# Patient Record
Sex: Female | Born: 1965
Health system: Southern US, Community
[De-identification: ages and names within clinical notes are randomized; demographics above are authoritative.]

## PROBLEM LIST (undated history)

## (undated) DIAGNOSIS — J449 Chronic obstructive pulmonary disease, unspecified: Secondary | ICD-10-CM

## (undated) DIAGNOSIS — J9611 Chronic respiratory failure with hypoxia: Secondary | ICD-10-CM

## (undated) DIAGNOSIS — E785 Hyperlipidemia, unspecified: Secondary | ICD-10-CM

## (undated) DIAGNOSIS — C3490 Malignant neoplasm of unspecified part of unspecified bronchus or lung: Secondary | ICD-10-CM

## (undated) DIAGNOSIS — I10 Essential (primary) hypertension: Secondary | ICD-10-CM

## (undated) DIAGNOSIS — R079 Chest pain, unspecified: Secondary | ICD-10-CM

## (undated) DIAGNOSIS — F319 Bipolar disorder, unspecified: Secondary | ICD-10-CM

## (undated) DIAGNOSIS — R42 Dizziness and giddiness: Secondary | ICD-10-CM

## (undated) DIAGNOSIS — R200 Anesthesia of skin: Secondary | ICD-10-CM

## (undated) DIAGNOSIS — E039 Hypothyroidism, unspecified: Secondary | ICD-10-CM

## (undated) DIAGNOSIS — J432 Centrilobular emphysema: Secondary | ICD-10-CM

## (undated) DIAGNOSIS — Z720 Tobacco use: Secondary | ICD-10-CM

## (undated) DIAGNOSIS — J441 Chronic obstructive pulmonary disease with (acute) exacerbation: Secondary | ICD-10-CM

## (undated) DIAGNOSIS — F329 Major depressive disorder, single episode, unspecified: Secondary | ICD-10-CM

## (undated) HISTORY — DX: Dizziness and giddiness: R42

## (undated) HISTORY — DX: Malignant neoplasm of unspecified part of unspecified bronchus or lung: C34.90

## (undated) HISTORY — DX: Chronic respiratory failure with hypoxia: J96.11

## (undated) HISTORY — DX: Hypothyroidism, unspecified: E03.9

## (undated) HISTORY — PX: ABDOMINAL HYSTERECTOMY: SUR658

## (undated) HISTORY — PX: LUNG REMOVAL, PARTIAL: SHX233

## (undated) HISTORY — DX: Chronic obstructive pulmonary disease, unspecified: J44.9

## (undated) HISTORY — DX: Bipolar disorder, unspecified: F31.9

## (undated) HISTORY — DX: Chest pain, unspecified: R07.9

## (undated) HISTORY — DX: Hyperlipidemia, unspecified: E78.5

## (undated) HISTORY — DX: Tobacco use: Z72.0

## (undated) HISTORY — DX: Major depressive disorder, single episode, unspecified: F32.9

## (undated) HISTORY — DX: Centrilobular emphysema: J43.2

## (undated) HISTORY — DX: Essential (primary) hypertension: I10

## (undated) HISTORY — PX: INCONTINENCE SURGERY: SHX676

## (undated) HISTORY — DX: Chronic obstructive pulmonary disease with (acute) exacerbation: J44.1

## (undated) HISTORY — DX: Anesthesia of skin: R20.0

---

## 2002-07-12 ENCOUNTER — Encounter: Payer: Self-pay | Admitting: Chiropractic Medicine

## 2002-07-12 ENCOUNTER — Ambulatory Visit (HOSPITAL_COMMUNITY): Admission: RE | Admit: 2002-07-12 | Discharge: 2002-07-12 | Payer: Self-pay | Admitting: Chiropractic Medicine

## 2002-08-03 ENCOUNTER — Encounter: Admission: RE | Admit: 2002-08-03 | Discharge: 2002-08-03 | Payer: Self-pay | Admitting: Unknown Physician Specialty

## 2002-08-03 ENCOUNTER — Encounter: Payer: Self-pay | Admitting: Unknown Physician Specialty

## 2006-05-31 ENCOUNTER — Inpatient Hospital Stay (HOSPITAL_COMMUNITY): Admission: EM | Admit: 2006-05-31 | Discharge: 2006-06-07 | Payer: Self-pay | Admitting: Psychiatry

## 2006-05-31 ENCOUNTER — Ambulatory Visit: Payer: Self-pay | Admitting: Psychiatry

## 2006-09-12 ENCOUNTER — Inpatient Hospital Stay (HOSPITAL_COMMUNITY): Admission: AD | Admit: 2006-09-12 | Discharge: 2006-09-20 | Payer: Self-pay | Admitting: Psychiatry

## 2006-09-12 ENCOUNTER — Ambulatory Visit: Payer: Self-pay | Admitting: Psychiatry

## 2007-11-08 ENCOUNTER — Ambulatory Visit (HOSPITAL_COMMUNITY): Admission: RE | Admit: 2007-11-08 | Discharge: 2007-11-08 | Payer: Self-pay | Admitting: Family Medicine

## 2007-11-17 ENCOUNTER — Ambulatory Visit: Payer: Self-pay | Admitting: Pulmonary Disease

## 2007-11-17 DIAGNOSIS — I10 Essential (primary) hypertension: Secondary | ICD-10-CM | POA: Insufficient documentation

## 2007-11-23 ENCOUNTER — Ambulatory Visit: Payer: Self-pay | Admitting: Pulmonary Disease

## 2007-11-23 ENCOUNTER — Encounter: Payer: Self-pay | Admitting: Pulmonary Disease

## 2007-11-23 ENCOUNTER — Ambulatory Visit: Admission: RE | Admit: 2007-11-23 | Discharge: 2007-11-23 | Payer: Self-pay | Admitting: Pulmonary Disease

## 2007-11-23 DIAGNOSIS — J984 Other disorders of lung: Secondary | ICD-10-CM

## 2007-11-28 ENCOUNTER — Ambulatory Visit: Payer: Self-pay | Admitting: Pulmonary Disease

## 2007-11-28 DIAGNOSIS — C3490 Malignant neoplasm of unspecified part of unspecified bronchus or lung: Secondary | ICD-10-CM

## 2007-11-28 HISTORY — DX: Malignant neoplasm of unspecified part of unspecified bronchus or lung: C34.90

## 2007-12-05 ENCOUNTER — Ambulatory Visit: Payer: Self-pay | Admitting: Pulmonary Disease

## 2007-12-07 ENCOUNTER — Ambulatory Visit: Payer: Self-pay | Admitting: Thoracic Surgery

## 2007-12-19 ENCOUNTER — Inpatient Hospital Stay (HOSPITAL_COMMUNITY): Admission: RE | Admit: 2007-12-19 | Discharge: 2007-12-24 | Payer: Self-pay | Admitting: Thoracic Surgery

## 2007-12-19 ENCOUNTER — Ambulatory Visit: Payer: Self-pay | Admitting: Thoracic Surgery

## 2007-12-19 ENCOUNTER — Encounter: Payer: Self-pay | Admitting: Thoracic Surgery

## 2007-12-19 ENCOUNTER — Ambulatory Visit: Payer: Self-pay | Admitting: Critical Care Medicine

## 2007-12-28 ENCOUNTER — Encounter: Admission: RE | Admit: 2007-12-28 | Discharge: 2007-12-28 | Payer: Self-pay | Admitting: Thoracic Surgery

## 2007-12-28 ENCOUNTER — Ambulatory Visit: Payer: Self-pay | Admitting: Thoracic Surgery

## 2008-01-05 ENCOUNTER — Ambulatory Visit: Payer: Self-pay | Admitting: Pulmonary Disease

## 2008-01-05 DIAGNOSIS — J432 Centrilobular emphysema: Secondary | ICD-10-CM

## 2008-01-05 HISTORY — DX: Centrilobular emphysema: J43.2

## 2008-01-11 ENCOUNTER — Encounter: Admission: RE | Admit: 2008-01-11 | Discharge: 2008-01-11 | Payer: Self-pay | Admitting: Thoracic Surgery

## 2008-01-11 ENCOUNTER — Ambulatory Visit: Payer: Self-pay | Admitting: Thoracic Surgery

## 2008-02-15 ENCOUNTER — Encounter: Admission: RE | Admit: 2008-02-15 | Discharge: 2008-02-15 | Payer: Self-pay | Admitting: Thoracic Surgery

## 2008-02-15 ENCOUNTER — Ambulatory Visit: Payer: Self-pay | Admitting: Thoracic Surgery

## 2008-05-16 ENCOUNTER — Ambulatory Visit: Payer: Self-pay | Admitting: Thoracic Surgery

## 2008-05-16 ENCOUNTER — Encounter: Admission: RE | Admit: 2008-05-16 | Discharge: 2008-05-16 | Payer: Self-pay | Admitting: Thoracic Surgery

## 2008-09-19 ENCOUNTER — Ambulatory Visit: Payer: Self-pay | Admitting: Thoracic Surgery

## 2008-09-19 ENCOUNTER — Encounter: Admission: RE | Admit: 2008-09-19 | Discharge: 2008-09-19 | Payer: Self-pay | Admitting: Thoracic Surgery

## 2008-12-10 ENCOUNTER — Encounter: Admission: RE | Admit: 2008-12-10 | Discharge: 2008-12-10 | Payer: Self-pay | Admitting: Legal Medicine

## 2008-12-14 ENCOUNTER — Encounter: Admission: RE | Admit: 2008-12-14 | Discharge: 2008-12-14 | Payer: Self-pay | Admitting: Legal Medicine

## 2008-12-17 ENCOUNTER — Encounter: Admission: RE | Admit: 2008-12-17 | Discharge: 2008-12-17 | Payer: Self-pay | Admitting: Legal Medicine

## 2008-12-17 ENCOUNTER — Encounter (INDEPENDENT_AMBULATORY_CARE_PROVIDER_SITE_OTHER): Payer: Self-pay | Admitting: Diagnostic Radiology

## 2009-03-06 ENCOUNTER — Ambulatory Visit: Payer: Self-pay | Admitting: Thoracic Surgery

## 2009-03-06 ENCOUNTER — Encounter: Admission: RE | Admit: 2009-03-06 | Discharge: 2009-03-06 | Payer: Self-pay | Admitting: Thoracic Surgery

## 2009-10-01 ENCOUNTER — Ambulatory Visit: Payer: Self-pay | Admitting: Thoracic Surgery

## 2009-10-01 ENCOUNTER — Encounter: Admission: RE | Admit: 2009-10-01 | Discharge: 2009-10-01 | Payer: Self-pay | Admitting: Thoracic Surgery

## 2010-01-14 ENCOUNTER — Encounter: Admission: RE | Admit: 2010-01-14 | Discharge: 2010-01-14 | Payer: Self-pay | Admitting: Physician Assistant

## 2010-03-22 ENCOUNTER — Other Ambulatory Visit: Payer: Self-pay | Admitting: Thoracic Surgery

## 2010-03-22 DIAGNOSIS — R911 Solitary pulmonary nodule: Secondary | ICD-10-CM

## 2010-04-09 ENCOUNTER — Ambulatory Visit: Payer: Self-pay | Admitting: Thoracic Surgery

## 2010-04-09 ENCOUNTER — Other Ambulatory Visit: Payer: Self-pay

## 2010-05-06 ENCOUNTER — Ambulatory Visit: Payer: Self-pay | Admitting: Thoracic Surgery

## 2010-07-08 ENCOUNTER — Inpatient Hospital Stay
Admission: RE | Admit: 2010-07-08 | Discharge: 2010-07-08 | Disposition: A | Discharge status: Home / Self Care | Payer: PRIVATE HEALTH INSURANCE | Source: Ambulatory Visit | Attending: Thoracic Surgery | Admitting: Thoracic Surgery

## 2010-07-08 ENCOUNTER — Ambulatory Visit: Payer: PRIVATE HEALTH INSURANCE | Admitting: Thoracic Surgery

## 2010-07-15 NOTE — Letter (Signed)
March 06, 2009   Barbaraann Share, MD,FCCP  [address]  [place], [place] [address]   Re:  Tami, Zamora                DOB:  Oct 05, 1965   Dear Mellody Dance,   I saw the patient back in the office today.  She is having some mild  dysphagia and I referred her medical doctor on that.  Otherwise, she is  doing well.  Her CT scan showed no evidence of recurrence for cancer at  1 year.  This is a preliminary report.  I have to call her if there is  any change in the final report.  Blood pressure is 118/84, pulse 100,  respirations 18, sats were 96%.   Ines Bloomer, M.D.  Electronically Signed   DPB/MEDQ  D:  03/06/2009  T:  03/06/2009  Job:  [id number]

## 2010-07-15 NOTE — H&P (Signed)
Tami Zamora, Tami Zamora                ACCOUNT NO.:  [id number]   MEDICAL RECORD NO.:  [id number]          PATIENT TYPE:  INP   LOCATION:  NA                           FACILITY:  [facility]   PHYSICIAN:  Ines Bloomer, M.D. DATE OF BIRTH:  22-Oct-1965   DATE OF ADMISSION:  DATE OF DISCHARGE:                              HISTORY & PHYSICAL   CHIEF COMPLAINT:  Left upper lobe lesion.   HISTORY OF PRESENT ILLNESS:  The patient was found to have a left upper  lobe lesion on chest x-ray.  She is a long-time smoker and smoked up to  a pack a day.  She underwent bronchoscopy and impressions favored  adenocarcinoma 2 cm.  Lymph node with a small area of cavitation.  No  hemoptysis, fever, chills, excessive sputum.  Pulmonary function tests  showed an FVC of 2.33 with FEV-1 of 1.7 and infusion capacity of 72%.  She is admitted for lobectomy.  PET scan was positive in this area and a  brain scan was negative.   MEDICATIONS:  1. Mobic 7.5 mg daily.  2. Lyrica 75 mg twice a day.  3. Amiloride 5 mg half tablet daily.  4. Synthroid 0.25 mcg daily.  5. Depakote 250 mg a day.  6. Trazodone 2 mg a day.  7. Valtrex 50 mg daily.  8. Perphenazine 2 mg daily.   ALLERGIES:  She is allergic to PENICILLIN.   She has hypertension, chronic arthritis, chronic pain and she has been  treated for depression.   SOCIAL HISTORY:  She is a smoker, married, does not drink alcohol on a  regular basis.  Smokes a pack a day.   REVIEW OF SYSTEMS:  She is 245 pounds.  She is 5 feet 8 inches.  She has  had some recent weight gain.  CARDIAC:  No angina or atrial  fibrillation.  PULMONARY:  She has cough, no wheezing.  GI: She has some  reflux and constipation.  GU: No kidney disease, dysuria or frequent  urination.  VASCULAR:  No claudication, DVT, TIAs.  NEUROLOGICAL:  She  has headaches, no dizziness, no blackouts or seizures.  MUSCULOSKELETAL:  She has arthritis and joint pain.  PSYCHIATRIC:  Treated for  nervousness, depression.  ENT: No change in eyesight or hearing.  HEMATOLOGICAL:  No problems with bleeding, clotting disorders or anemia.   PHYSICAL EXAMINATION:  VITAL SIGNS:  Her blood pressure 127/76, pulse  86, respirations 18.  Saturations are 86%.  HEENT:  Head is atraumatic.  Eyes: Pupils equal to light and  accommodation.  Extraocular movements normal.  Ears: Tympanic membranes  are intact.  Nose: There is no septal deviation.  Throat without  lesions.  NECK:  Supple without thyromegaly.  There is no supraclavicular or  axillary adenopathy.  CHEST:  Clear to auscultation and percussion.  HEART:  Regular sinus with no murmurs.  ABDOMEN:  Soft.  There is no splenomegaly.  EXTREMITIES:  Pulses 2+.  There is no clubbing or edema.  NEUROLOGIC:  She is oriented x3.  Sensory and motor intact.  Cranial  nerves intact.  IMPRESSION:  1. Adenocarcinoma of left upper lobe.  2. History of tobacco abuse.  3. History of depression.  4. Arthritis.  5. Hypertension.   PLAN:  Is for left VATS and left upper lobectomy.      Ines Bloomer, M.D.  Electronically Signed     DPB/MEDQ  D:  12/15/2007  T:  12/15/2007  Job:  [id number]

## 2010-07-15 NOTE — Op Note (Signed)
Tami Zamora, Tami Zamora                ACCOUNT NO.:  [id number]   MEDICAL RECORD NO.:  [id number]          PATIENT TYPE:  INP   LOCATION:  2314                         FACILITY:  [facility]   PHYSICIAN:  Ines Bloomer, M.D. DATE OF BIRTH:  Feb 19, 1966   DATE OF PROCEDURE:  12/19/2007  DATE OF DISCHARGE:                               OPERATIVE REPORT   PREOPERATIVE DIAGNOSIS:  Adenocarcinoma of left upper lobe.   POSTOPERATIVE DIAGNOSIS:  Adenocarcinoma of left upper lobe.   OPERATION:  Left VATS, minithoracotomy, and left upper lobectomy.   SURGEON:  Ines Bloomer, MD   FIRST ASSISTANT:  Rowe Clack, PA-C   After percutaneous insertion of all monitoring lines, the patient  underwent general anesthesia and was prepped and draped in the usual  sterile manner.  He was turned to the right lateral thoracotomy  position.  Two trocar sites were made in the anterior and posterior  axillary line in the seventh intercostal space and a zero scope was  inserted and the lesion was seen in the posterior segment of the left  upper lobe.  Pictures were taken.  Then, a small posterolateral  thoracotomy was made over the triangle of auscultation.  The latissimus  was partially divided.  The serratus was reflected anteriorly.  The  fifth intercostal space was entered.  Dissection was started posteriorly  dissecting out several 10L nodes and the second superiorly to the AP  window dissecting out five nodes.  We then dissected superiorly,  dissecting out the superior pulmonary vein and looped it with a vascular  tape.  The inferior pulmonary ligament was taken down, dissecting out 9L  node, and then dissection was carried starting in the fissure, dividing  the superior portion of the fissure with the Auto Suture 60 stapler and  then the inferior portion of the fissure with the Auto Suture 60 green  stapler.  A small anterior branch was clipped, but there was some  bleeding from the base, so we sutured  that with a horizontal mattress  Prolene suture.  We then doubly ligated and divided the lingular  branches with 2-0 silk, clipping and dividing.  The apical branches were  dissected out and divided with an Auto Suture gray 2-mm stapler.  Superior pulmonary vein was divided with an Dentist.  Bronchus was dissected with a TA-30 and divided distally.  Two chest  tubes were placed through the trocar sites and tied in place with 0  silk.  The chest was marked.  CoSeal was applied to the staple line.  A  single On-Q was inserted in the usual fashion.  Marcaine block was done  in the usual fashion.  The chest was closed with 4 pericostals, drilling  through the sixth rib and passing around the fifth rib, #1 Vicryl in the  muscle layer, and Ethicon skin and 2-0 Vicryl to the subcutaneous tissue  and 3-0 Vicryl subcuticular stitch and Dermabond for the skin.  The  patient returned to the recovery room in stable condition.      Ines Bloomer, M.D.  Electronically  Signed     DPB/MEDQ  D:  12/19/2007  T:  12/19/2007  Job:  [id number]

## 2010-07-15 NOTE — Letter (Signed)
January 11, 2008   Barbaraann Share, MD, [facility]  [address] [place], [place] [address]   Re:  Tami Zamora, Tami Zamora                DOB:  1965/05/19   Dear Mellody Dance,   I saw the patient back in the office today.  She is still having Zamora  moderate amount of post thoracotomy pain.  Her blood pressure is 106/70,  pulse 80, respirations 18, and sats are 93%.  Lungs are clear to  auscultation and percussion.  I will plan to see her back again.  Her  incisions are well healed.  She was having some pain on abduction of her  shoulders.  I put her on some shoulder exercises.  We will see you back  again in 6 weeks with Zamora chest x-ray and however, by that time her pain  will be improved.   Ines Bloomer, M.D.  Electronically Signed   DPB/MEDQ  D:  01/11/2008  T:  01/11/2008  Job:  [id number]

## 2010-07-15 NOTE — Op Note (Signed)
NAMEEMONII, WIENKE                ACCOUNT NO.:  [id number]   MEDICAL RECORD NO.:  [id number]          PATIENT TYPE:  AMB   LOCATION:  CARD                         FACILITY:  [facility]   PHYSICIAN:  Barbaraann Share, MD,FCCPDATE OF BIRTH:  07/18/1965   DATE OF PROCEDURE:  11/23/2007  DATE OF DISCHARGE:                               OPERATIVE REPORT   PROCEDURE:  Flexible fiberoptic bronchoscopy with biopsy.   OPERATOR:  Barbaraann Share, MD,FCCP   INDICATIONS FOR PROCEDURE:  Left upper lobe cavitary density of unknown  etiology.   MEDICATIONS:  Versed 15 mg IV in various aliquots, Demerol 50 mg IV and  Romazicon 0.2 mg IV at the end of the procedure.  Also topical 1%  lidocaine to vocal cords and airways during the procedure.   DESCRIPTION OF PROCEDURE:  After obtaining informed consent and under  close cardiopulmonary monitoring the above preop anesthesia was given  and the fiberoptic scope was passed through the right naris and into the  posterior pharynx.  There were no lesions or other abnormalities seen.  Vocal cords appeared to be within normal limits and moved bilaterally on  phonation.  The scope was then passed into the trachea where it was  examined along its entire length down to the level of carina all of  which was normal.  The left and right tracheobronchial trees were  examined to the subsegmental level and were normal except for some mild  erythema and edema in the apical posterior segment of the left upper  lobe.  Bronchoalveolar lavage was then done in various segments of the  left upper lobe and sent for the usual cytologic and bacteriologic  evaluation.  Bronchial brushes and transbronchial lung biopsies were  then done under fluoroscopic guidance in the apical posterior segment of  the left upper lobe and appeared to be in the area of the mass in  question.  On the one transbronchial biopsy, there was a mild to  moderate amount of bleeding which was well-controlled  with the wedged  scope/ tamponade technique.  The patient remained hemodynamically stable  throughout the procedure and had adequate O2 saturations.  There  appeared to be excellent control of her bleeding at the end of the  procedure.  There were no immediate complications and a portable upright  chest x-ray will be done to rule out pneumothorax post transbronchial  lung biopsy.      Barbaraann Share, MD,FCCP  Electronically Signed     KMC/MEDQ  D:  11/23/2007  T:  11/23/2007  Job:  [id number]

## 2010-07-15 NOTE — Letter (Signed)
October 01, 2009   Barbaraann Share, MD,FCCP  [address]  [place], [place] [address]   Re:  Tami Zamora, Tami Zamora                DOB:  September 25, 1965   Dear Dr. Shelle Iron:   The patient came for followup today.  She is now almost 18 months since  we did her resection for Zamora left upper lobe T1 lesion and her CT scan  showed no evidence of recurrence.  We will continue to follow with  another 78-month CT.  Her blood pressure was 126/90, pulse 100,  respirations 18, and sats were 95%.  Lungs are clear to auscultation and  percussion.   Ines Bloomer, M.D.  Electronically Signed   DPB/MEDQ  D:  10/01/2009  T:  10/02/2009  Job:  [id number]

## 2010-07-15 NOTE — Letter (Signed)
December 07, 2007   Barbaraann Share, MD, [facility]  [address] [place], [place] [address]   Re:  Tami Zamora, Tami Zamora                DOB:  [birth year]   Dear Mellody Dance,   I appreciate the opportunity of seeing the patient.  This 45 year old  patient was found to have Zamora left upper lobe lesion.  On Zamora chest x-ray,  she is Zamora long-time smoker and still smokes up to Zamora pack of cigarettes Zamora  day.  She underwent Zamora bronchoscopy, which had brushings that  favored  adenocarcinoma.  This is Zamora 2-cm lesion on left upper lobe with Zamora small  area of cavitation.  She has had no hemoptysis, fever, chills, or  excessive sputum.  Pulmonary function tests showed an FVC of 2.33 with  an FEV-1 of 1.7 and effusion capacity of 72%.   MEDICATIONS:  Mobic 7.5 mg Zamora day, Lyrica 75 mg twice Zamora day, amiloride 5  mg half daily, Synthroid 0.25 mcg Zamora day, Depakote 250 mg Zamora day,  trazodone 50 mg Zamora day, Valtrex 500 mg daily, perphenazine 2 mg daily.   ALLERGIES:  She is allergic to penicillin.   FAMILY HISTORY:  Positive for lung disease in the family.   SOCIAL HISTORY:  She is Zamora smoker.  She is married and does not drink  alcohol on Zamora regular basis.  Smokes 1 pack of cigarettes Zamora day.   REVIEW OF SYSTEMS:  Vital Signs:  She is 145 pounds.  She is 5 feet 8  inches.  She has had some recent weight gain.  Cardiac:  No angina or  atrial fibrillation.  Pulmonary:  She has had Zamora cough.  Has no wheezing  and shortness of breath with exertion.  GI:  She has reflux and  constipation.  GU:  No kidney disease, dysuria, or frequent urination.  Vascular:  No claudication, DVT, or TIAs.  Neurologic:  She has  headaches.  Musculoskeletal:  She has arthritis and joint pain.  Psychiatric:  She has been for treated for nervous and depression.  Eye/ENT:  No change in eyesight or hearing.  Hematologic:  No problems  with bleeding, clotting disorders, or anemia.   PHYSICAL EXAMINATION:  VITAL SIGNS:  Her blood pressure is 127/76,  pulse  86, respirations 18, and sats were 96%.  HEAD, EYES, EARS, NOSE, AND THROAT:  Unremarkable.  NECK:  Supple without thyromegaly.  There is no supraclavicular or  axillary adenopathy.  CHEST:  Clear to auscultation and percussion.  HEART:  Regular sinus rhythm.  No murmurs.  ABDOMEN:  Soft.  No hepatosplenomegaly.  Pulses are 2+.  There is no  clubbing or edema.  NEUROLOGIC:  She is oriented x3.  Sensory and motor intact.   I discussed the necessity of stopping smoking with the patient, but she  is Zamora satisfactory candidate with given her PET scan being negative, then  for left upper lobectomy.  Hopefully, she is Zamora stage IA or IB.  I will  plan to do the surgery on the December 19, 2007, at [facility].  I  will let you know when she is admitted.  I appreciate the opportunity of  seeing the patient.   Ines Bloomer, M.D.  Electronically Signed   DPB/MEDQ  D:  12/07/2007  T:  12/07/2007  Job:  [id number]

## 2010-07-15 NOTE — Letter (Signed)
September 19, 2008   Barbaraann Share, MD, [facility]  [address]  [place], [place] [address]   Re:  Tami Zamora, Tami Zamora                DOB:  1965/08/29   Dear Mellody Dance,   I saw the patient back today and her 1-month CT showed no evidence of  recurrence of any cancer.  She is still requiring lumbar injections for  chronic back pain.  Her blood pressure was 120/77, pulse 99,  respirations 18, and saturations were 97%.  I will plan to see her back  again in 6 months and we will repeat her CT scan at that time, which  will be approximately 1 year.   Ines Bloomer, M.D.  Electronically Signed   DPB/MEDQ  D:  09/19/2008  T:  09/20/2008  Job:  [id number]

## 2010-07-15 NOTE — Letter (Signed)
May 16, 2008   Barbaraann Share, MD, [facility]  [address] [place], [place] [address]   Re:  DESHAUN, SCHOU                DOB:  [birth year]   Dear Mellody Dance,   I saw the patient came back today.  She is now approximately 7 months  since we did a left lower lobe segmental resection for a stage IB  adenocarcinoma.  She is doing well overall.  She is going to have some  lumbar injections for her back from neurosurgeon.  Her blood pressure is  110/60, pulse 74, respirations 18, and sats are 94%.  I think, we need  to repeat her CT scan, so I will see her again in 4 months with a CT  scan and they will put her on a 67-month followup schedule.   Ines Bloomer, M.D.  Electronically Signed   DPB/MEDQ  D:  05/16/2008  T:  05/16/2008  Job:  [id number]

## 2010-07-15 NOTE — Assessment & Plan Note (Signed)
OFFICE VISIT   LORRY, FURBER A  DOB:  [birth year]                                        February 15, 2008  CHART #:  [id number]   The patient came today.  Her shoulder is much better.  Her chest x-ray  is stable.  She had much less pain.  She will see Dr. Shelle Iron in 4 months  and I will see her again in 3 months.  I have released to return to  work.   Ines Bloomer, M.D.  Electronically Signed   DPB/MEDQ  D:  02/15/2008  T:  02/15/2008  Job:  [id number]

## 2010-07-15 NOTE — Discharge Summary (Signed)
NAMESHERRA, KIMMONS NO.:  [id number]   MEDICAL RECORD NO.:  [id number]          PATIENT TYPE:  INP   LOCATION:  3315                         FACILITY:  [facility]   PHYSICIAN:  Ines Bloomer, M.D. DATE OF BIRTH:  1965/11/12   DATE OF ADMISSION:  12/19/2007  DATE OF DISCHARGE:  12/23/2007                               DISCHARGE SUMMARY   HISTORY:  The patient is a 45 year old female smoker who was referred to  Dr. Edwyna Shell for evaluation of a left upper lobe lung lesion.  She  underwent a bronchoscopy and this favored adenocarcinoma.  She was  subsequently found an PET scan to be positive in this area.  Her brain  scan was negative for evidence of metastasis.  Her pulmonary function  studies were felt to be adequate for resection with an FVC of 2.33 and  an FEV1 of 1.7 and an diffusion capacity of 72%.  She was admitted this  hospitalization for lobectomy.   MEDICATIONS PRIOR TO ADMISSION:  1. Mobic 7.5 mg daily.  2. Lyrica 75 mg b.i.d.  3. Amiloride at 5 mg one half tablet q.a.m.  4. Synthroid 0.025 mcg daily.  5. Depakote DR 250 mg b.i.d.  6. Trazodone 50 mg 1 q.p.m.  7. Perphenazine 2 mg q.p.m.  8. Valtrex 500 mg nightly.  9. Chantix 1 mg b.i.d.  10.Clarithromycin 500 mg b.i.d.  11.Symbicort 2 puffs b.i.d.  .   ALLERGIES:  She is allergic to PENICILLIN.   PAST MEDICAL HISTORY:  Hypertension, arthritis, chronic pain, and  history of depression.   FAMILY HISTORY:  Please see the history and physical done at the time of  admission.   SOCIAL HISTORY:  Please see the history and physical done at the time of  admission.   REVIEW OF SYSTEMS:  Please see the history and physical done at the time  of admission.   PHYSICAL EXAMINATION:  Please see the history and physical done at the  time of admission.   HOSPITAL COURSE:  The patient was admitted electively, and on December 19, 2007, was taken to the operating room where she underwent a left  VATS with  mini thoracotomy and left upper lobectomy.  She tolerated the  procedure well and was taken to the postanesthesia care unit in stable  condition.   POSTOPERATIVE HOSPITAL COURSE:  Overall, the patient has done quite  well.  All routine lines, monitors, and drainage devices have been  discontinued in the standard fashion.  She was seen in Pulmonology  consultation by Dr. Delford Field and Dr. Shelle Iron, who managed her pulmonary  treatments throughout the postoperative course with nebulizers and other  pulmonary toilet.  Pathology has returned and it reveals a invasive and  moderately differentiated adenocarcinoma of 2.0 cm with focal visceral  pleural involvement.  There was no angiolymphatic invasion identified.  The resection margin was negative for neoplasm.  The 4 hilar lymph nodes  were negative for metastatic carcinoma.  All other lymph nodes in the  lymph node sampling were negative for metastatic carcinoma.  The patient  has been weaned  from oxygen and maintained good saturations currently on  2 L and will be discontinued entirely prior to discharge.  Incision is  healing well without evidence of infection.  She is tolerating gradual  increase in activity.  She is using standard protocols.  Overall, her  status is felt to be tentatively stable for discharge in the morning of  December 23, 2007, pending morning around reevaluation.   MEDICATIONS ON DISCHARGE:  1. Lyrica 75 mg b.i.d.  2. Amiloride 2.5 mg q.a.m.  3. Synthroid 0.025 mcg daily.  4. Depakote DR 250 mg b.i.d.  5. Trazodone 50 mg q.p.m.  6. Perphenazine 2 mg q.p.m.  7. Valtrex 500 mg nightly.  8. Chantix 1 mg b.i.d.  9. Symbicort 2 puffs b.i.d.  10.__________ p.r.n.  11.Spiriva 18 mcg daily 1 inhalation for pain.  12.Tylox 1-2 every 4-6 hours as needed.   FOLLOWUP:  Dr. Edwyna Shell in 1 week with a chest x-ray at the [facility] office.   FINAL DIAGNOSIS:  Adenocarcinoma as described a T2 N0 lesion.   OTHER DIAGNOSES:  1. History  of tobacco abuse.  2. History of hypertension.  3. History of chronic arthritis and chronic pain.  4. History of depression.   LABORATORY DATA:  Most recent hemoglobin and hematocrit dated on December 21, 2007, are 11.6 and 34.5 respectively.  Electrolytes; BUN, and  creatinine are within normal limits.      Rowe Clack, P.A.-C.      Ines Bloomer, M.D.  Electronically Signed    WEG/MEDQ  D:  12/22/2007  T:  12/23/2007  Job:  [id number]   cc:   Ines Bloomer, M.D.  Barbaraann Share, MD,FCCP  Charlcie Cradle Delford Field, MD, FCCP

## 2010-07-15 NOTE — Letter (Signed)
December 28, 2007   Barbaraann Share, MD,F[facility]P  [address]  [place]  [place] [address].   Re:  Tami Zamora, Tami Zamora                DOB:  June 06, 1965   Dear Mellody Dance,   I saw the patient in the office today.  She is still having some cough,  but is feeling better.  Zamora chest x-ray shows better aeration.  She had  some drainage from one chest tube that is stopped.  We removed her chest  tube sutures.  Her lungs were clear bilaterally.  Her pain is slowly  decreased, and we gave her refill for Tylox #60 and told her gradually  increase her activity.  We will see her back again in 2 weeks with Zamora  chest x-ray.  She will see you next week.   Ines Bloomer, M.D.  Electronically Signed   DPB/MEDQ  D:  12/28/2007  T:  12/29/2007  Job:  [id number]

## 2010-07-18 NOTE — Discharge Summary (Signed)
NAMENALIYA, Zamora NO.:  [id number]   MEDICAL RECORD NO.:  [id number]          PATIENT TYPE:  IPS   LOCATION:  0304                          FACILITY:  [facility]   PHYSICIAN:  Anselm Jungling, MD  DATE OF BIRTH:  Jul 02, 1965   DATE OF ADMISSION:  05/31/2006  DATE OF DISCHARGE:  06/07/2006                               DISCHARGE SUMMARY   IDENTIFYING DATA/REASON FOR ADMISSION:  This was an inpatient  psychiatric admission for Tami Zamora, a 45 year old separated woman who  stated I'm overwhelmed with my situation.  She described impending  divorce, financial and parenting problems.  She stated that she got  tired, and began drinking alcohol heavily and, along with this, took  some pills to go to sleep and then took too many.  The medication  was trazodone.  The patient had been seeing Dr. Bayard Males, a  psychiatrist.  She stated that she had a history of recurring  depression.  She came to [place] on a regimen of trazodone, Ativan, Remeron,  and Wellbutrin.  She admitted to alcohol abuse and dependence.  Please  refer to the admission note for further details pertaining to the  symptoms, circumstances and history that led to her hospitalization.   INITIAL DIAGNOSTIC IMPRESSION:  She was given an initial AXIS I  diagnosis of major depressive disorder, recurrent, alcohol abuse, and  marital problem.   MEDICAL/LABORATORY:  The patient was medically and physically assessed  by the psychiatric nurse practitioner.  She had a history of  hypothyroidism, and was continued on Synthroid 75 mcg daily.  In  addition, she stated she had a history of endometriosis.  There were no  acute medical issues.   HOSPITAL COURSE:  The patient was admitted to the adult inpatient  psychiatric service.  She presented as a tired-looking woman, who was  nonetheless alert and fully oriented.  She was pleasant, but sad and  depressed.  She denied suicidal ideation.  There were no signs or  symptoms  of psychosis or thought disorder.  I feel really depressed,  I'm tired of having to deal with everything.  She was not terribly  clear as to any thoughts or plans to harm herself in regards to the  future.  She stated I'm really confused.   The patient was involved in therapeutic groups and activities, including  those geared towards 12-step recovery.  She was continued on a  psychotropic regimen of Wellbutrin and Remeron.  In addition, to address  anxiety, she was begun on a trial of Neurontin 300 mg b.i.d., which the  patient perceived as very helpful.   The patient was also placed on a Librium protocol for alcohol cessation.  Her detoxification proceeded relatively uneventfully.   There was a family session, on the fourth hospital day, involving the  patient and her husband.  In that meeting, she again stated that she was  not having any thoughts or plans of suicide.  They discussed the  patient's stress level at home, and the fact of having many adult  children living in their home.  They both  acknowledged their marriage  problems and their lack of communication.  They discussed the patient's  aftercare needs.  The patient stated that she would go Alcohol[facility], and they agreed to go to marriage counseling together.  They  were given the pamphlet on suicide prevention and the crisis hotline.   On the eighth hospital day, the patient appeared to be quite a bit  improved.  Her mood was less depressed, and she had been absent any  suicidal ideation for several days.  She had completed her alcohol  cessation protocol.  She agreed to the following aftercare plan.   AFTERCARE:  The patient was to follow up with Tami Zamora for  individual counseling on June 10, 2006.  She was [facility] [place] [facility] intake and was given specific dates  and time she could do this.   DISCHARGE MEDICATIONS:  1. Remeron 15 mg q.h.s.  2. Synthroid 75 mcg  daily.  3. Wellbutrin XL 300 mg daily.  4. Neurontin 300 mg b.i.d.   DISCHARGE DIAGNOSES:  AXIS I:  Depressive disorder not otherwise  specified.  Alcohol abuse, early remission.  AXIS II:  Deferred.  AXIS III:  History of hypothyroidism, endometriosis.  AXIS IV:  Stressors:  Severe.  AXIS V:  GAF on discharge 65.      Anselm Jungling, MD  Electronically Signed     SPB/MEDQ  D:  06/08/2006  T:  06/08/2006  Job:  [phone]

## 2010-07-18 NOTE — Discharge Summary (Signed)
[name], Zamora NO.:  [id number]   MEDICAL RECORD NO.:  [id number]          PATIENT TYPE:  IPS   LOCATION:  0602                          FACILITY:  [facility]   PHYSICIAN:  Anselm Jungling, MD  DATE OF BIRTH:  Jul 26, 1965   DATE OF ADMISSION:  09/12/2006  DATE OF DISCHARGE:  09/20/2006                               DISCHARGE SUMMARY   IDENTIFYING DATA AND REASON FOR ADMISSION:  This was the second [facility]  admission for Tami Zamora, a 45 year old married white female who was  admitted due to suicidal ideation.  She presented with a blood alcohol  level of 0.9 on the day of admission, became agitated and combative, and  needed restraints.  Upon admission, she denied an alcohol problem.  She  came to [place] as a patient of Dr. Pandora Leiter and Lewanda Rife.  Her primary  stressor was her husband's infidelity.  She came to [place] on a regimen of  Neurontin and Wellbutrin.  Please refer to the admission note for  further details pertaining to the symptoms, circumstances and history  that led to her hospitalization.  She was given initial axis I diagnoses  of mood disorder NOS, alcohol abuse, and marital problem.   MEDICAL AND LABORATORY:  The patient came to [place] with a history of  hypothyroidism and hypertension.  She was continued on Synthroid and  hydrochlorothiazide.  She was medically and physically assessed by the  psychiatric nurse practitioner.   HOSPITAL COURSE:  The patient was admitted to the adult inpatient  psychiatric service.  She presented as a well-nourished, well-developed  woman who was initially disheveled, looked quite ill and hungover.  However, she was alert and fully-oriented.  There were no signs or  symptoms of psychosis or thought disorder.  There were no further  suicidal thoughts, plans or intention.  The patient verbalized a strong  desire for help.  She was continued on her usual Depakote ER, and also  treated with low-dose Risperdal 0.5 mg at bed.  She  was continued on  Neurontin 300 mg 3 times daily.  She participated in various therapeutic  groups and activities.  By the 4th hospital day, the patient stated she  was getting better, but also stated I am worried about going home, I  need to change some things.  She still felt somewhat hopeless and  helpless.   On the 6th hospital day, the patient met with representatives of  [facility] to discuss aftercare for alcoholism and another  treatment needs.   She continued to be a reasonably good participant in treatment program  following this, but continued to experience spikes in anxiety and  agitation over learning that her husband had re-involved himself in  various forms of phone sex.  The patient expressed fears that if she  went home to him that she would have another crisis.  She stated I am  so confused, I do not know what to do.   The patient worked with our case manager towards a plan that involved a  transitioning her back to appropriate outpatient services  through  [facility].  The patient agreed to the following aftercare  plan.   AFTERCARE:  The patient was to follow up with Dr. Pandora Leiter on September 22, 2006, and with Mr. Julious Oka on July 24.   DISCHARGE MEDICATIONS:  1. Risperdal 0.5 mg nightly.  2. Synthroid 50 mcg daily.  3. Neurontin 300 mg t.i.d.  4. Hydrochlorothiazide 25 mg daily  5. Depakote ER 750 mg nightly.   DISCHARGE DIAGNOSES:  AXIS I:  Major depressive disorder, recurrent with  anxious features, and marital problem and alcohol abuse.  AXIS II:  Deferred.  AXIS III:  History of hypertension, hypothyroidism.  AXIS IV:  Stressors severe.  AXIS V:  Global assessment of functioning on discharge 55.      Anselm Jungling, MD  Electronically Signed     SPB/MEDQ  D:  10/12/2006  T:  10/12/2006  Job:  [id number]

## 2010-12-01 LAB — CULTURE, RESPIRATORY W GRAM STAIN

## 2010-12-01 LAB — ACID-FAST (MYCOBACTERIA) SMEAR AND CULTURE WITH REFLEX TO IDENTIFICATION: Acid Fast Smear: NONE SEEN

## 2010-12-01 LAB — FUNGUS CULTURE W SMEAR: Fungal Smear: NONE SEEN

## 2010-12-02 LAB — URINALYSIS, ROUTINE W REFLEX MICROSCOPIC
Bilirubin Urine: NEGATIVE
Hgb urine dipstick: NEGATIVE
Ketones, ur: 15 — AB
Nitrite: NEGATIVE
Specific Gravity, Urine: 1.031 — ABNORMAL HIGH
Urobilinogen, UA: 1
pH: 6

## 2010-12-02 LAB — CBC
HCT: 41.4
Hemoglobin: 11.4 — ABNORMAL LOW
Hemoglobin: 13.9
MCV: 94.8
MCV: 95.4
RBC: 3.31 — ABNORMAL LOW
RBC: 3.55 — ABNORMAL LOW
RBC: 3.61 — ABNORMAL LOW
RBC: 4.37
WBC: 10.3
WBC: 9.2
WBC: 9.5

## 2010-12-02 LAB — GLUCOSE, CAPILLARY
Glucose-Capillary: 100 — ABNORMAL HIGH
Glucose-Capillary: 103 — ABNORMAL HIGH
Glucose-Capillary: 104 — ABNORMAL HIGH
Glucose-Capillary: 109 — ABNORMAL HIGH
Glucose-Capillary: 113 — ABNORMAL HIGH
Glucose-Capillary: 119 — ABNORMAL HIGH
Glucose-Capillary: 119 — ABNORMAL HIGH
Glucose-Capillary: 120 — ABNORMAL HIGH
Glucose-Capillary: 125 — ABNORMAL HIGH
Glucose-Capillary: 127 — ABNORMAL HIGH
Glucose-Capillary: 130 — ABNORMAL HIGH

## 2010-12-02 LAB — COMPREHENSIVE METABOLIC PANEL
ALT: 9
AST: 19
Albumin: 2.9 — ABNORMAL LOW
Alkaline Phosphatase: 70
Alkaline Phosphatase: 87
BUN: 13
CO2: 23
CO2: 32
Chloride: 101
Chloride: 96
Creatinine, Ser: 0.63
Creatinine, Ser: 0.66
GFR calc Af Amer: >60
GFR calc non Af Amer: >60
GFR calc non Af Amer: >60
Potassium: 3.8
Potassium: 4.4
Total Bilirubin: 0.5
Total Bilirubin: 0.9

## 2010-12-02 LAB — BLOOD GAS, ARTERIAL
Bicarbonate: 26 — ABNORMAL HIGH
TCO2: 27.2
pCO2 arterial: 39.8
pH, Arterial: 7.429 — ABNORMAL HIGH
pO2, Arterial: 79 — ABNORMAL LOW

## 2010-12-02 LAB — POCT I-STAT 3, ART BLOOD GAS (G3+)
O2 Saturation: 93
TCO2: 31
pCO2 arterial: 54.9 — ABNORMAL HIGH
pO2, Arterial: 73 — ABNORMAL LOW

## 2010-12-02 LAB — PROTIME-INR: INR: 0.9

## 2010-12-02 LAB — TYPE AND SCREEN
ABO/RH(D): O POS
Antibody Screen: NEGATIVE

## 2010-12-02 LAB — URINE MICROSCOPIC-ADD ON

## 2010-12-02 LAB — BASIC METABOLIC PANEL
Calcium: 8 — ABNORMAL LOW
GFR calc Af Amer: >60
GFR calc non Af Amer: >60
Sodium: 134 — ABNORMAL LOW

## 2010-12-02 LAB — APTT: aPTT: 34

## 2010-12-02 LAB — ABO/RH: ABO/RH(D): O POS

## 2010-12-03 LAB — GLUCOSE, CAPILLARY: Glucose-Capillary: 102 — ABNORMAL HIGH

## 2010-12-15 LAB — CBC
Platelets: 244
RDW: 13

## 2010-12-15 LAB — COMPREHENSIVE METABOLIC PANEL
ALT: 30
Albumin: 3.6
Alkaline Phosphatase: 83
Potassium: 4.2
Sodium: 135
Total Protein: 7

## 2011-06-17 ENCOUNTER — Other Ambulatory Visit: Payer: Self-pay | Admitting: Family Medicine

## 2011-06-17 DIAGNOSIS — Z1231 Encounter for screening mammogram for malignant neoplasm of breast: Secondary | ICD-10-CM

## 2011-07-01 ENCOUNTER — Ambulatory Visit: Payer: PRIVATE HEALTH INSURANCE

## 2014-06-26 ENCOUNTER — Encounter: Payer: Self-pay | Admitting: *Deleted

## 2014-06-27 ENCOUNTER — Institutional Professional Consult (permissible substitution): Payer: PRIVATE HEALTH INSURANCE | Admitting: Pulmonary Disease

## 2014-07-26 ENCOUNTER — Ambulatory Visit (INDEPENDENT_AMBULATORY_CARE_PROVIDER_SITE_OTHER): Payer: PRIVATE HEALTH INSURANCE | Admitting: Pulmonary Disease

## 2014-07-26 ENCOUNTER — Encounter: Payer: Self-pay | Admitting: Pulmonary Disease

## 2014-07-26 VITALS — BP 132/68 | HR 103 | Ht 60.0 in | Wt 121.0 lb

## 2014-07-26 DIAGNOSIS — J9611 Chronic respiratory failure with hypoxia: Secondary | ICD-10-CM | POA: Insufficient documentation

## 2014-07-26 DIAGNOSIS — J432 Centrilobular emphysema: Secondary | ICD-10-CM | POA: Diagnosis not present

## 2014-07-26 DIAGNOSIS — C3492 Malignant neoplasm of unspecified part of left bronchus or lung: Secondary | ICD-10-CM

## 2014-07-26 DIAGNOSIS — Z720 Tobacco use: Secondary | ICD-10-CM | POA: Diagnosis not present

## 2014-07-26 HISTORY — DX: Tobacco use: Z72.0

## 2014-07-26 HISTORY — DX: Chronic respiratory failure with hypoxia: J96.11

## 2014-07-26 MED ORDER — TIOTROPIUM BROMIDE-OLODATEROL 2.5-2.5 MCG/ACT IN AERS: 2.0000 | INHALATION_SPRAY | Freq: Every day | RESPIRATORY_TRACT | Status: DC

## 2014-07-26 NOTE — Assessment & Plan Note (Signed)
She had stage I adenocarcinoma diagnosed in 2009. Since that time she has had serial CT scans which has not shown recurrence. Unfortunately, she continues to smoke. She was advised today of the fact that this greatly increases her risk for recurrent or a new primary lung cancer. At this time there is no indication for further imaging based on current guidelines.

## 2014-07-26 NOTE — Addendum Note (Signed)
Addended by: Len Blalock on: 07/26/2014 03:10 PM   Modules accepted: Orders

## 2014-07-26 NOTE — Assessment & Plan Note (Signed)
We will start 2 L daily at bedtime and with exertion. Portable oxygen concentrator ordered.

## 2014-07-26 NOTE — Patient Instructions (Signed)
Stop smoking Take the Stiolto 2 puffs daily no matter how you feel His albuterol as needed for shortness of breath line use 2 L of oxygen when you exert herself and while sleeping We will see back in 6 months or sooner if needed

## 2014-07-26 NOTE — Assessment & Plan Note (Signed)
Advised at length to quit. She failed Chantix.  She prefers to try to quit cold [place].

## 2014-07-26 NOTE — Progress Notes (Signed)
Subjective:    Patient ID: Tami Zamora, female    DOB: 1966-02-24, 49 y.o.   MRN: [id number]  HPI Chief Complaint  Patient presents with  . Advice Only    Referred by Margarito Courser PA for abn CT chest.     This is a very pleasant 49 year old female who comes to my clinic today for evaluation of shortness of breath. She says that she was diagnosed as COPD many years ago. She has smoked up to 2 packs of cigarettes daily since age 89 and continues to smoke one pack of cigarettes daily now. She has never been hospitalized primarily for respiratory problem with the exception of in 2009 she had a left upper lobectomy because of stage IA lung cancer. Since that time she has been followed by thoracic surgery with annual CT scans which is not shown evidence of recurrent disease. She has previously followed with a pulmonologist in one of the adjacent communities to our town and had been treated with oxygen at 2 L at night as well as on exertion. However, she says that she had her oxygen company come take this home over a year ago. She uses albuterol very rarely for shortness of breath. She previously was treated with multiple inhaled therapies but she currently only takes albuterol when necessary.  She tells me that she gets short of breath when she walks, this is frequently associated with a dry cough and wheezing. It is worse in the summertime.  She tried Chantix in the past and it "made [her] bipolar worse".  Past Medical History  Diagnosis Date  . Hyperlipidemia   . Hypertension   . Lung cancer     dx 2009  . Hypothyroidism   . COPD (chronic obstructive pulmonary disease)   . Bipolar affective disorder      Family History  Problem Relation Age of Onset  . Hypertension Paternal Uncle   . Emphysema Mother   . Emphysema Maternal Aunt   . Heart disease Cousin   . Heart disease Paternal Grandfather   . Cancer Father     liver  . Heart attack Mother      History   Social History  .  Marital Status: Married    Spouse Name: N/A  . Number of Children: N/A  . Years of Education: N/A   Occupational History  . Not on file.   Social History Main Topics  . Smoking status: Current Every Day Smoker -- 1.00 packs/day for 36 years    Types: Cigarettes  . Smokeless tobacco: Never Used     Comment: trying to quit- down to .75ppd 07/26/14  . Alcohol Use: No  . Drug Use: Not on file  . Sexual Activity: Not on file   Other Topics Concern  . Not on file   Social History Narrative     Allergies  Allergen Reactions  . Morphine And Related   . Penicillins      No outpatient prescriptions prior to visit.   No facility-administered medications prior to visit.       Review of Systems  Constitutional: Negative for fever and unexpected weight change.  HENT: Positive for congestion. Negative for dental problem, ear pain, nosebleeds, postnasal drip, rhinorrhea, sinus pressure, sneezing, sore throat and trouble swallowing.   Eyes: Negative for redness and itching.  Respiratory: Positive for cough and shortness of breath. Negative for chest tightness and wheezing.   Cardiovascular: Negative for palpitations and leg swelling.  Gastrointestinal: Negative for  nausea and vomiting.  Genitourinary: Negative for dysuria.  Musculoskeletal: Negative for joint swelling.  Skin: Negative for rash.  Neurological: Negative for headaches.  Hematological: Does not bruise/bleed easily.  Psychiatric/Behavioral: Negative for dysphoric mood. The patient is not nervous/anxious.        Objective:   Physical Exam  Filed Vitals:   07/26/14 1401  BP: 132/68  Pulse: 103  Height: 5' (1.524 m)  Weight: 121 lb (54.885 kg)  SpO2: 95%  RA  Ambulate 500 feet in the office today and her O2 saturation dropped to 84% on room air. Corrected with 2 L nasal cannula  Gen: well appearing, no acute distress HENT: NCAT, OP clear, neck supple without masses Eyes: PERRL, EOMi Lymph: no cervical  lymphadenopathy PULM: CTA B CV: RRR, no mgr, no JVD GI: BS+, soft, nontender, no hsm Derm: no rash or skin breakdown MSK: normal bulk and tone Neuro: A&Ox4, CN II-XII intact, strength 5/5 in all 4 extremities Psyche: normal mood and affect  March 2016 CT chest images personally reviewed: Left upper lobectomy, mild centrilobular emphysema in the right upper lobe as well as in the superior segment of the left lower lobe. There is no evidence of a nodule or abnormal tissue. 07/2014 Simple spirometry: RAtio 57%, FEV1 0.87L (36% pred), personally reviewed Primary care physician notes were reviewed and she was referred to [place] in that visit, her hypothyroidism as well as prediabetes was managed during this visit.  2009 Lung biopsy report: 1. LUNG, LEFT UPPER LOBE, SEGMENTAL RESECTION: - INVASIVE MODERATELY DIFFERENTIATED ADENOCARCINOMA, 2.0 CM, WITH FOCAL VISCERAL PLEURAL INVOLVEMENT. - NO ANGIOLYMPHATIC INVASION IDENTIFIED. - RESECTION MARGIN IS NEGATIVE FOR NEOPLASM. - FOUR HILAR LYMPH NODES, NEGATIVE FOR METASTATIC CARCINOMA     Assessment & Plan:  Adenocarcinoma of lung, stage 1 She had stage I adenocarcinoma diagnosed in 2009. Since that time she has had serial CT scans which has not shown recurrence. Unfortunately, she continues to smoke. She was advised today of the fact that this greatly increases her risk for recurrent or a new primary lung cancer. At this time there is no indication for further imaging based on current guidelines.   Centrilobular emphysema, COPD COPD: GOLD Grade C Combined recommendations from the [place], [employer], Investment banker, corporate, [place] (Qaseem A et al, Ann Intern Med. 2011;155(3):179) recommends tobacco cessation, pulmonary rehab (for symptomatic patients with an FEV1 < 50% predicted), supplemental oxygen (for patients with SaO2 <88% or paO2 <55), and appropriate  bronchodilator therapy.  In regards to long acting bronchodilators, they recommend monotherapy (FEV1 60-80% with symptoms weak evidence, FEV1 with symptoms <60% strong evidence), or combination therapy (FEV1 <60% with symptoms, strong recommendation, moderate evidence).  One should also provide patients with annual immunizations and consider therapy for prevention of COPD exacerbations (ie. roflumilast or azithromycin) when appopriate.  -O2 therapy: 2L with exertion and qHS -Immunizations: refuses -Tobacco use: Advised at length to quit -Exercise: encouraged regular exercise -Bronchodilator therapy: Start Stiolto daily, continue prn albuterol -Exacerbation prevention: quit smoking    Tobacco abuse Advised at length to quit. She failed Chantix.  She prefers to try to quit cold [place].   Chronic hypoxemic respiratory failure We will start 2 L daily at bedtime and with exertion. Portable oxygen concentrator ordered.      Current outpatient prescriptions:  .  albuterol (PROVENTIL HFA;VENTOLIN HFA) 108 (90 BASE) MCG/ACT inhaler, Inhale 2 puffs into the lungs every 6 (six) hours as needed for wheezing  or shortness of breath., Disp: , Rfl:  .  cyclobenzaprine (FLEXERIL) 5 MG tablet, Take 5 mg by mouth 3 (three) times daily as needed for muscle spasms., Disp: , Rfl:  .  Divalproex Sodium (DEPAKOTE PO), Take 750 mg by mouth daily., Disp: , Rfl:  .  HYDROcodone-acetaminophen (NORCO) 10-325 MG per tablet, Take 1 tablet by mouth daily., Disp: , Rfl:  .  LORazepam (ATIVAN) 1 MG tablet, Take 1 mg by mouth daily as needed for anxiety., Disp: , Rfl:  .  traZODone (DESYREL) 100 MG tablet, Take 200 mg by mouth at bedtime., Disp: , Rfl:  .  Tiotropium Bromide-Olodaterol (STIOLTO RESPIMAT) 2.5-2.5 MCG/ACT AERS, Inhale 2 puffs into the lungs daily., Disp: 4 g, Rfl: 5

## 2014-07-26 NOTE — Assessment & Plan Note (Addendum)
COPD: GOLD Grade C Combined recommendations from the [employer], [employer], Investment banker, corporate, [place] (Qaseem A et al, Ann Intern Med. 2011;155(3):179) recommends tobacco cessation, pulmonary rehab (for symptomatic patients with an FEV1 < 50% predicted), supplemental oxygen (for patients with SaO2 <88% or paO2 <55), and appropriate bronchodilator therapy.  In regards to long acting bronchodilators, they recommend monotherapy (FEV1 60-80% with symptoms weak evidence, FEV1 with symptoms <60% strong evidence), or combination therapy (FEV1 <60% with symptoms, strong recommendation, moderate evidence).  One should also provide patients with annual immunizations and consider therapy for prevention of COPD exacerbations (ie. roflumilast or azithromycin) when appopriate.  -O2 therapy: 2L with exertion and qHS -Immunizations: refuses -Tobacco use: Advised at length to quit -Exercise: encouraged regular exercise -Bronchodilator therapy: Start Stiolto daily, continue prn albuterol -Exacerbation prevention: quit smoking

## 2014-12-08 DIAGNOSIS — J441 Chronic obstructive pulmonary disease with (acute) exacerbation: Secondary | ICD-10-CM | POA: Insufficient documentation

## 2014-12-08 DIAGNOSIS — F329 Major depressive disorder, single episode, unspecified: Secondary | ICD-10-CM | POA: Insufficient documentation

## 2014-12-08 HISTORY — DX: Chronic obstructive pulmonary disease with (acute) exacerbation: J44.1

## 2014-12-08 HISTORY — DX: Major depressive disorder, single episode, unspecified: F32.9

## 2014-12-13 ENCOUNTER — Telehealth: Payer: Self-pay | Admitting: *Deleted

## 2014-12-13 NOTE — Telephone Encounter (Signed)
Submitted PA for [employer] thru Cover My Meds. Key: [id number] Pt ID: [id number] Sent for review. Will await response.

## 2014-12-20 NOTE — Telephone Encounter (Signed)
Received notification that the Stiolto Respimat has been denied.

## 2014-12-20 NOTE — Telephone Encounter (Signed)
Called and spoke with pt Advised pt of denial of Stiolto and BQ request of bringing medication formulary to office for review Pt stated that she would contact insurance company and bring to office once she has it  Will hold message in triage until paperwork is brought for review

## 2014-12-20 NOTE — Telephone Encounter (Signed)
We need a medication forumlary to know what to precribe

## 2015-06-18 ENCOUNTER — Other Ambulatory Visit: Payer: Self-pay | Admitting: Family Medicine

## 2015-06-18 DIAGNOSIS — N63 Unspecified lump in unspecified breast: Secondary | ICD-10-CM

## 2015-06-24 ENCOUNTER — Ambulatory Visit
Admission: RE | Admit: 2015-06-24 | Discharge: 2015-06-24 | Disposition: A | Discharge status: Home / Self Care | Payer: PRIVATE HEALTH INSURANCE | Source: Ambulatory Visit | Attending: Family Medicine | Admitting: Family Medicine

## 2015-06-24 DIAGNOSIS — N63 Unspecified lump in unspecified breast: Secondary | ICD-10-CM

## 2017-07-02 ENCOUNTER — Encounter: Payer: Self-pay | Admitting: Cardiology

## 2017-07-09 ENCOUNTER — Encounter: Payer: Self-pay | Admitting: Cardiology

## 2017-07-09 DIAGNOSIS — R0602 Shortness of breath: Secondary | ICD-10-CM | POA: Diagnosis not present

## 2018-02-09 ENCOUNTER — Other Ambulatory Visit: Payer: Self-pay | Admitting: Internal Medicine

## 2018-02-09 DIAGNOSIS — Z1231 Encounter for screening mammogram for malignant neoplasm of breast: Secondary | ICD-10-CM

## 2018-03-10 ENCOUNTER — Ambulatory Visit
Admission: RE | Admit: 2018-03-10 | Discharge: 2018-03-10 | Disposition: A | Discharge status: Home / Self Care | Payer: BLUE CROSS/BLUE SHIELD | Source: Ambulatory Visit | Attending: Internal Medicine | Admitting: Internal Medicine

## 2018-03-10 ENCOUNTER — Encounter: Payer: Self-pay | Admitting: Radiology

## 2018-03-10 DIAGNOSIS — Z1231 Encounter for screening mammogram for malignant neoplasm of breast: Secondary | ICD-10-CM

## 2018-05-01 DIAGNOSIS — Z9861 Coronary angioplasty status: Secondary | ICD-10-CM

## 2018-05-01 DIAGNOSIS — I2510 Atherosclerotic heart disease of native coronary artery without angina pectoris: Secondary | ICD-10-CM

## 2018-05-01 DIAGNOSIS — I249 Acute ischemic heart disease, unspecified: Secondary | ICD-10-CM

## 2018-05-01 HISTORY — DX: Coronary angioplasty status: Z98.61

## 2018-05-01 HISTORY — DX: Acute ischemic heart disease, unspecified: I24.9

## 2018-05-01 HISTORY — DX: Atherosclerotic heart disease of native coronary artery without angina pectoris: I25.10

## 2018-05-10 ENCOUNTER — Encounter: Payer: Self-pay | Admitting: Cardiology

## 2018-05-10 HISTORY — PX: LEFT HEART CATH AND CORONARY ANGIOGRAPHY: CATH118249

## 2018-05-10 HISTORY — PX: CORONARY STENT INTERVENTION: CATH118234

## 2018-05-11 HISTORY — PX: TRANSTHORACIC ECHOCARDIOGRAM: SHX275

## 2018-05-19 ENCOUNTER — Ambulatory Visit (INDEPENDENT_AMBULATORY_CARE_PROVIDER_SITE_OTHER): Payer: BLUE CROSS/BLUE SHIELD | Admitting: Cardiology

## 2018-05-19 ENCOUNTER — Other Ambulatory Visit: Payer: Self-pay

## 2018-05-19 ENCOUNTER — Ambulatory Visit (HOSPITAL_BASED_OUTPATIENT_CLINIC_OR_DEPARTMENT_OTHER)
Admission: RE | Admit: 2018-05-19 | Discharge: 2018-05-19 | Disposition: A | Discharge status: Home / Self Care | Payer: BLUE CROSS/BLUE SHIELD | Source: Ambulatory Visit | Attending: Cardiology | Admitting: Cardiology

## 2018-05-19 VITALS — BP 128/78 | HR 76 | Ht 60.0 in | Wt 129.0 lb

## 2018-05-19 DIAGNOSIS — E785 Hyperlipidemia, unspecified: Secondary | ICD-10-CM | POA: Insufficient documentation

## 2018-05-19 DIAGNOSIS — R079 Chest pain, unspecified: Secondary | ICD-10-CM | POA: Insufficient documentation

## 2018-05-19 DIAGNOSIS — I10 Essential (primary) hypertension: Secondary | ICD-10-CM | POA: Diagnosis not present

## 2018-05-19 DIAGNOSIS — I2510 Atherosclerotic heart disease of native coronary artery without angina pectoris: Secondary | ICD-10-CM

## 2018-05-19 DIAGNOSIS — E782 Mixed hyperlipidemia: Secondary | ICD-10-CM

## 2018-05-19 DIAGNOSIS — J432 Centrilobular emphysema: Secondary | ICD-10-CM | POA: Insufficient documentation

## 2018-05-19 DIAGNOSIS — I25118 Atherosclerotic heart disease of native coronary artery with other forms of angina pectoris: Secondary | ICD-10-CM | POA: Insufficient documentation

## 2018-05-19 DIAGNOSIS — R200 Anesthesia of skin: Secondary | ICD-10-CM

## 2018-05-19 DIAGNOSIS — J441 Chronic obstructive pulmonary disease with (acute) exacerbation: Secondary | ICD-10-CM | POA: Diagnosis present

## 2018-05-19 DIAGNOSIS — R0602 Shortness of breath: Secondary | ICD-10-CM | POA: Diagnosis not present

## 2018-05-19 DIAGNOSIS — R42 Dizziness and giddiness: Secondary | ICD-10-CM

## 2018-05-19 DIAGNOSIS — I252 Old myocardial infarction: Secondary | ICD-10-CM

## 2018-05-19 HISTORY — DX: Old myocardial infarction: I25.2

## 2018-05-19 HISTORY — DX: Atherosclerotic heart disease of native coronary artery with other forms of angina pectoris: I25.118

## 2018-05-19 HISTORY — DX: Chest pain, unspecified: R07.9

## 2018-05-19 HISTORY — DX: Dizziness and giddiness: R42

## 2018-05-19 HISTORY — DX: Anesthesia of skin: R20.0

## 2018-05-19 MED ORDER — ISOSORBIDE MONONITRATE ER 30 MG PO TB24: 30.0000 mg | ORAL_TABLET | Freq: Every day | ORAL | 3 refills | Status: DC

## 2018-05-19 MED ORDER — PREDNISONE 20 MG PO TABS: ORAL_TABLET | ORAL | 0 refills | Status: DC

## 2018-05-19 MED ORDER — DILTIAZEM HCL ER COATED BEADS 240 MG PO CP24: 240.0000 mg | ORAL_CAPSULE | Freq: Every day | ORAL | 3 refills | Status: DC

## 2018-05-19 MED ORDER — NITROGLYCERIN 0.4 MG SL SUBL: 0.4000 mg | SUBLINGUAL_TABLET | SUBLINGUAL | 3 refills | Status: DC | PRN

## 2018-05-19 NOTE — Patient Instructions (Signed)
Medication Instructions:  Your physician has recommended you make the following change in your medication:   STOP metoprolol succinate (toprol XL)  START nitroglycerin as needed for chest pain: When having chest pain, stop what you are doing and sit down. Take 1 nitro, wait 5 minutes. Still having chest pain, take 1 nitro, wait 5 minutes. Still having chest pain, take 1 nitro, dial 911. Total of 3 nitro in 15 minutes.  START isosorbide mononitrate (indur) 30 mg: Take 1 tablet daily START diltiazem (cardizem CD) 240 mg: Take 1 capsule daily  START prednisone 20 mg: Take 3 tablets daily 3 days. Take 2 tablets daily a day for 3 days. Take 1 tablet daily for 3 days. Take 1/2 tablet daily for 4 days.  If you need a refill on your cardiac medications before your next appointment, please call your pharmacy.   Lab work: Your physician recommends that you return for lab work today: STAT troponin I, CMP, Lipid panel.   If you have labs (blood work) drawn today and your tests are completely normal, you will receive your results only by: [facility] MyChart Message (if you have MyChart) OR . A paper copy in the mail If you have any lab test that is abnormal or we need to change your treatment, we will call you to review the results.  Testing/Procedures: You had an EKG today.   A chest x-ray takes a picture of the organs and structures inside the chest, including the heart, lungs, and blood vessels. This test can show several things, including, whether the heart is enlarges; whether fluid is building up in the lungs; and whether pacemaker / defibrillator leads are still in place.   Follow-Up: At [facility], you and your health needs are our priority.  As part of our continuing mission to provide you with exceptional heart care, we have created designated Provider Care Teams.  These Care Teams include your primary Cardiologist (physician) and Advanced Practice Providers (APPs -  Physician Assistants and  Nurse Practitioners) who all work together to provide you with the care you need, when you need it. You will need a follow up appointment:   **You will be scheduled for a tele-visit in 1 week.     Nitroglycerin sublingual tablets What is this medicine? NITROGLYCERIN (nye troe GLI ser in) is a type of vasodilator. It relaxes blood vessels, increasing the blood and oxygen supply to your heart. This medicine is used to relieve chest pain caused by angina. It is also used to prevent chest pain before activities like climbing stairs, going outdoors in cold weather, or sexual activity. This medicine may be used for other purposes; ask your health care provider or pharmacist if you have questions. COMMON BRAND NAME(S): Nitroquick, Nitrostat, Nitrotab What should I tell my health care provider before I take this medicine? They need to know if you have any of these conditions: -anemia -head injury, recent stroke, or bleeding in the brain -liver disease -previous heart attack -an unusual or allergic reaction to nitroglycerin, other medicines, foods, dyes, or preservatives -pregnant or trying to get pregnant -breast-feeding How should I use this medicine? Take this medicine by mouth as needed. At the first sign of an angina attack (chest pain or tightness) place one tablet under your tongue. You can also take this medicine 5 to 10 minutes before an event likely to produce chest pain. Follow the directions on the prescription label. Let the tablet dissolve under the tongue. Do not swallow whole. Replace  the dose if you accidentally swallow it. It will help if your mouth is not dry. Saliva around the tablet will help it to dissolve more quickly. Do not eat or drink, smoke or chew tobacco while a tablet is dissolving. If you are not better within 5 minutes after taking ONE dose of nitroglycerin, call 9-1-1 immediately to seek emergency medical care. Do not take more than 3 nitroglycerin tablets over 15  minutes. If you take this medicine often to relieve symptoms of angina, your doctor or health care professional may provide you with different instructions to manage your symptoms. If symptoms do not go away after following these instructions, it is important to call 9-1-1 immediately. Do not take more than 3 nitroglycerin tablets over 15 minutes. Talk to your pediatrician regarding the use of this medicine in children. Special care may be needed. Overdosage: If you think you have taken too much of this medicine contact a poison control center or emergency room at once. NOTE: This medicine is only for you. Do not share this medicine with others. What if I miss a dose? This does not apply. This medicine is only used as needed. What may interact with this medicine? Do not take this medicine with any of the following medications: -certain migraine medicines like ergotamine and dihydroergotamine (DHE) -medicines used to treat erectile dysfunction like sildenafil, tadalafil, and vardenafil -riociguat This medicine may also interact with the following medications: -alteplase -aspirin -heparin -medicines for high blood pressure -medicines for mental depression -other medicines used to treat angina -phenothiazines like chlorpromazine, mesoridazine, prochlorperazine, thioridazine This list may not describe all possible interactions. Give your health care provider a list of all the medicines, herbs, non-prescription drugs, or dietary supplements you use. Also tell them if you smoke, drink alcohol, or use illegal drugs. Some items may interact with your medicine. What should I watch for while using this medicine? Tell your doctor or health care professional if you feel your medicine is no longer working. Keep this medicine with you at all times. Sit or lie down when you take your medicine to prevent falling if you feel dizzy or faint after using it. Try to remain calm. This will help you to feel better  faster. If you feel dizzy, take several deep breaths and lie down with your feet propped up, or bend forward with your head resting between your knees. You may get drowsy or dizzy. Do not drive, use machinery, or do anything that needs mental alertness until you know how this drug affects you. Do not stand or sit up quickly, especially if you are an older patient. This reduces the risk of dizzy or fainting spells. Alcohol can make you more drowsy and dizzy. Avoid alcoholic drinks. Do not treat yourself for coughs, colds, or pain while you are taking this medicine without asking your doctor or health care professional for advice. Some ingredients may increase your blood pressure. What side effects may I notice from receiving this medicine? Side effects that you should report to your doctor or health care professional as soon as possible: -blurred vision -dry mouth -skin rash -sweating -the feeling of extreme pressure in the head -unusually weak or tired Side effects that usually do not require medical attention (report to your doctor or health care professional if they continue or are bothersome): -flushing of the face or neck -headache -irregular heartbeat, palpitations -nausea, vomiting This list may not describe all possible side effects. Call your doctor for medical advice about side effects.  You may report side effects to FDA at 1-800-FDA-1088. Where should I keep my medicine? Keep out of the reach of children. Store at room temperature between 20 and 25 degrees C (68 and 77 degrees F). Store in [employer]. Protect from light and moisture. Keep tightly closed. Throw away any unused medicine after the expiration date. NOTE: This sheet is a summary. It may not cover all possible information. If you have questions about this medicine, talk to your doctor, pharmacist, or health care provider.  2019 Elsevier/Gold Standard (2012-12-15 17:57:36)     Diltiazem extended-release capsules  or tablets What is this medicine? DILTIAZEM (dil TYE a zem) is a calcium-channel blocker. It affects the amount of calcium found in your heart and muscle cells. This relaxes your blood vessels, which can reduce the amount of work the heart has to do. This medicine is used to treat high blood pressure and chest pain caused by angina. This medicine may be used for other purposes; ask your health care provider or pharmacist if you have questions. COMMON BRAND NAME(S): Cardizem CD, Cardizem LA, Cardizem SR, Cartia XT, Dilacor XR, Dilt-CD, Diltia XT, Diltzac, Matzim LA, Rema Fendt, Tiamate, Tiazac What should I tell my health care provider before I take this medicine? They need to know if you have any of these conditions: -heart problems, low blood pressure, irregular heartbeat -liver disease -previous heart attack -an unusual or allergic reaction to diltiazem, other medicines, foods, dyes, or preservatives -pregnant or trying to get pregnant -breast-feeding How should I use this medicine? Take this medicine by mouth with a glass of water. Follow the directions on the prescription label. Swallow whole, do not crush or chew. Ask your doctor or pharmacist if your should take this medicine with food. Take your doses at regular intervals. Do not take your medicine more often then directed. Do not stop taking except on the advice of your doctor or health care professional. Ask your doctor or health care professional how to gradually reduce the dose. Talk to your pediatrician regarding the use of this medicine in children. Special care may be needed. Overdosage: If you think you have taken too much of this medicine contact a poison control center or emergency room at once. NOTE: This medicine is only for you. Do not share this medicine with others. What if I miss a dose? If you miss a dose, take it as soon as you can. If it is almost time for your next dose, take only that dose. Do not take double or extra  doses. What may interact with this medicine? Do not take this medicine with any of the following medications: -cisapride -hawthorn -pimozide -ranolazine -red yeast rice This medicine may also interact with the following medications: -buspirone -carbamazepine -cimetidine -cyclosporine -digoxin -local anesthetics or general anesthetics -lovastatin -medicines for anxiety or difficulty sleeping like midazolam and triazolam -medicines for high blood pressure or heart problems -quinidine -rifampin, rifabutin, or rifapentine This list may not describe all possible interactions. Give your health care provider a list of all the medicines, herbs, non-prescription drugs, or dietary supplements you use. Also tell them if you smoke, drink alcohol, or use illegal drugs. Some items may interact with your medicine. What should I watch for while using this medicine? Check your blood pressure and pulse rate regularly. Ask your doctor or health care professional what your blood pressure and pulse rate should be and when you should contact him or her. You may feel dizzy or lightheaded. Do not drive,  use machinery, or do anything that needs mental alertness until you know how this medicine affects you. To reduce the risk of dizzy or fainting spells, do not sit or stand up quickly, especially if you are an older patient. Alcohol can make you more dizzy or increase flushing and rapid heartbeats. Avoid alcoholic drinks. What side effects may I notice from receiving this medicine? Side effects that you should report to your doctor or health care professional as soon as possible: -allergic reactions like skin rash, itching or hives, swelling of the face, lips, or tongue -confusion, mental depression -feeling faint or lightheaded, falls -redness, blistering, peeling or loosening of the skin, including inside the mouth -slow, irregular heartbeat -swelling of the feet and ankles -unusual bleeding or bruising,  pinpoint red spots on the skin Side effects that usually do not require medical attention (report to your doctor or health care professional if they continue or are bothersome): -constipation or diarrhea -difficulty sleeping -facial flushing -headache -nausea, vomiting -sexual dysfunction -weak or tired This list may not describe all possible side effects. Call your doctor for medical advice about side effects. You may report side effects to FDA at 1-800-FDA-1088. Where should I keep my medicine? Keep out of the reach of children. Store at room temperature between 15 and 30 degrees C (59 and 86 degrees F). Protect from humidity. Throw away any unused medicine after the expiration date. NOTE: This sheet is a summary. It may not cover all possible information. If you have questions about this medicine, talk to your doctor, pharmacist, or health care provider.  2019 Elsevier/Gold Standard (2007-06-09 14:35:47)     Isosorbide Mononitrate extended-release tablets What is this medicine? ISOSORBIDE MONONITRATE (eye soe SOR bide mon oh NYE trate) is a vasodilator. It relaxes blood vessels, increasing the blood and oxygen supply to your heart. This medicine is used to prevent chest pain caused by angina. It will not help to stop an episode of chest pain. This medicine may be used for other purposes; ask your health care provider or pharmacist if you have questions. COMMON BRAND NAME(S): Imdur, Isotrate ER What should I tell my health care provider before I take this medicine? They need to know if you have any of these conditions: -previous heart attack or heart failure -an unusual or allergic reaction to isosorbide mononitrate, nitrates, other medicines, foods, dyes, or preservatives -pregnant or trying to get pregnant -breast-feeding How should I use this medicine? Take this medicine by mouth with a glass of water. Follow the directions on the prescription label. Do not crush or chew. Take  your medicine at regular intervals. Do not take your medicine more often than directed. Do not stop taking this medicine except on the advice of your doctor or health care professional. Talk to your pediatrician regarding the use of this medicine in children. Special care may be needed. Overdosage: If you think you have taken too much of this medicine contact a poison control center or emergency room at once. NOTE: This medicine is only for you. Do not share this medicine with others. What if I miss a dose? If you miss a dose, take it as soon as you can. If it is almost time for your next dose, take only that dose. Do not take double or extra doses. What may interact with this medicine? Do not take this medicine with any of the following medications: -medicines used to treat erectile dysfunction (ED) like avanafil, sildenafil, tadalafil, and vardenafil -riociguat This medicine may  also interact with the following medications: -medicines for high blood pressure -other medicines for angina or heart failure This list may not describe all possible interactions. Give your health care provider a list of all the medicines, herbs, non-prescription drugs, or dietary supplements you use. Also tell them if you smoke, drink alcohol, or use illegal drugs. Some items may interact with your medicine. What should I watch for while using this medicine? Check your heart rate and blood pressure regularly while you are taking this medicine. Ask your doctor or health care professional what your heart rate and blood pressure should be and when you should contact him or her. Tell your doctor or health care professional if you feel your medicine is no longer working. You may get dizzy. Do not drive, use machinery, or do anything that needs mental alertness until you know how this medicine affects you. To reduce the risk of dizzy or fainting spells, do not sit or stand up quickly, especially if you are an older patient.  Alcohol can make you more dizzy, and increase flushing and rapid heartbeats. Avoid alcoholic drinks. Do not treat yourself for coughs, colds, or pain while you are taking this medicine without asking your doctor or health care professional for advice. Some ingredients may increase your blood pressure. What side effects may I notice from receiving this medicine? Side effects that you should report to your doctor or health care professional as soon as possible: -bluish discoloration of lips, fingernails, or palms of hands -irregular heartbeat, palpitations -low blood pressure -nausea, vomiting -persistent headache -unusually weak or tired Side effects that usually do not require medical attention (report to your doctor or health care professional if they continue or are bothersome): -flushing of the face or neck -rash This list may not describe all possible side effects. Call your doctor for medical advice about side effects. You may report side effects to FDA at 1-800-FDA-1088. Where should I keep my medicine? Keep out of the reach of children. Store between 15 and 30 degrees C (59 and 86 degrees F). Keep container tightly closed. Throw away any unused medicine after the expiration date. NOTE: This sheet is a summary. It may not cover all possible information. If you have questions about this medicine, talk to your doctor, pharmacist, or health care provider.  2019 Elsevier/Gold Standard (2012-12-16 14:48:19)

## 2018-05-19 NOTE — Progress Notes (Signed)
Cardiology Office Note:    Date:  05/19/2018   ID:  [name], DOB 07-10-1965, MRN [id number]  PCP:  Cher Nakai, MD  Cardiologist:  Shirlee More, MD   Referring MD: Adron Bene, PA-C  ASSESSMENT:    1. CAD in native artery   2. History of ST elevation myocardial infarction (STEMI)   3. Essential hypertension   4. Mixed hyperlipidemia   5. Centrilobular emphysema, COPD   6. Chronic obstructive pulmonary disease with acute exacerbation (HCC)   7. Chest pain, unspecified type   8. Shortness of breath    PLAN:    In order of problems listed above:  Recent Echo at [facility] normal LV function, moderate AR, echo at [facility] with mild AR, her exam does not indicate significant AR  1. Her symptoms are atypical for acute stent thrombosis she has obvious decompensated COPD and I suspect is the cause of her clinical presentation.  For completeness we will check labs including a troponin and a chest x-ray she may well have pneumonia she will continue her bronchodilators we will switch from beta-blocker which has worsened her respiratory status to a rate limiting calcium channel blocker and a short course of steroids for quick improvement.  If chest x-ray shows pneumonia or troponin positive she need to switch to an inpatient evaluation especially if she had high risk markers on chest x-ray. 2. Recent MI compliant with her dual antiplatelet therapy she had no other significant coronary stenosis 3. Switch to calcium channel blocker to avoid bronchospasm 4. Continue high intensity statin check a CMP for liver function lipid profile 5. Worsened see plan above 6. Worsen see plan above 7. Atypical differential diagnosis is due to decompensated COPD with bronchospasm versus cardiac ischemia from early stent thrombosis she has no ST segment elevation MI her troponin is normal we will go ahead and treat her for obviously decompensated COPD and do a tele visit in 1 week with the coronavirus occurrence 8. Due  to decompensated heart failure discontinue beta-blocker initiate steroids  Next appointment tele visit 1 week   Medication Adjustments/Labs and Tests Ordered: Current medicines are reviewed at length with the patient today.  Concerns regarding medicines are outlined above.  Orders Placed This Encounter  Procedures   DG Chest 2 View   Troponin I   Comprehensive Metabolic Panel (CMET)   Lipid Profile   Meds ordered this encounter  Medications   nitroGLYCERIN (NITROSTAT) 0.4 MG SL tablet    Sig: Place 1 tablet (0.4 mg total) under the tongue every 5 (five) minutes as needed for chest pain.    Dispense:  25 tablet    Refill:  3   isosorbide mononitrate (IMDUR) 30 MG 24 hr tablet    Sig: Take 1 tablet (30 mg total) by mouth daily.    Dispense:  30 tablet    Refill:  3   diltiazem (CARDIZEM CD) 240 MG 24 hr capsule    Sig: Take 1 capsule (240 mg total) by mouth daily.    Dispense:  30 capsule    Refill:  3   predniSONE (DELTASONE) 20 MG tablet    Sig: Take 3 tablets daily 3 days. Take 2 tablets daily a day for 3 days. Take 1 tablet daily for 3 days. Take 1/2 tablet daily for 4 days.    Dispense:  20 tablet    Refill:  0     Chief Complaint  Patient presents with   Chest Pain  after STEMI    History of Present Illness:    Tami Zamora is a 53 y.o. female who is being seen today for the evaluation of CAD at the request of Adron Bene, [place].  Review of records in epic seen by pulmonary 2016 history of cigarette smoking COPD and lung cancer stage Ia with left upper lobectomy.  Other problems include hypertension hyperlipidemia hypothyroidism and bipolar affective disorder.  She is not doing well since recent inferior MI PCI and stent left circumflex marginal branch.  She is having pain in the right upper extremity arterial access in the tracks her arterial course and she has extensive ecchymosis but a good pulse.  Her COPD is clearly worsened she is quite  bronchospastic and short of breath with activity and she is unsure whether the tightness in her chest is cardiac or underlying COPD she was placed on a beta-blocker.  With the emergency of her PCI and stent she never had a chest x-ray she has a nonproductive cough she is having back pain that is pleuritic in nature and she thinks she may have had a fever she is diaphoretic at nighttime.  She has been unimproved using her bronchodilator at home as well as aerosol.  She also has had chest tightness with her exertional shortness of breath but is not the same as her presentation to the hospital with acute MI.  Her EKG in my office today is normal.  She complains of back pain worse with a deep breath and radiates up into her neck.  Overall she feels weak and debilitated.  She has been compliant with her medications including dual antiplatelet without interruption  Past Medical History:  Diagnosis Date   Adenocarcinoma of lung, stage 1 ([place]) 11/28/2007   2009 Left upper lobectomy: 1. LUNG, LEFT UPPER LOBE, SEGMENTAL RESECTION: - INVASIVE MODERATELY DIFFERENTIATED ADENOCARCINOMA, 2.0 CM, WITH FOCAL VISCERAL PLEURAL INVOLVEMENT. - NO ANGIOLYMPHATIC INVASION IDENTIFIED. - RESECTION MARGIN IS NEGATIVE FOR NEOPLASM. - FOUR HILAR LYMPH NODES, NEGATIVE FOR METASTATIC CARCINOMA   Overview:  Overview:  2009 Left upper lobectomy: 1. LUNG, LEFT UPPER   Bipolar affective disorder ([place])    Centrilobular emphysema, COPD 01/05/2008   May 2016 simple spirometry FEV1 0.87 L (36% predicted)   Overview:  Overview:  May 2016 simple spirometry FEV1 0.87 L (36% predicted)  Last Assessment & Plan:  COPD: GOLD Grade C Combined recommendations from the [place], [employer]icians, Investment banker, corporate, [place] (Qaseem A et al, Lelon Frohlich Intern Med. 2011;155(3):179) recommends    Chest pain 05/19/2018   Chronic hypoxemic respiratory failure ([place]) 07/26/2014   2L qHS  and with exertoin  Overview:  Overview:  2L qHS and with exertoin  Last Assessment & Plan:  We will start 2 L daily at bedtime and with exertion. Portable oxygen concentrator ordered.   Chronic obstructive pulmonary disease with acute exacerbation ([place]) 12/08/2014   COPD (chronic obstructive pulmonary disease) ([place])    Dizzy 05/19/2018   Hyperlipidemia    Hypertension    Hypothyroidism    Lung cancer ([place])    dx 2009   Major depressive disorder without psychotic features 12/08/2014   Numbness in feet 05/19/2018   Tobacco abuse 07/26/2014   Failed Chantix "made my bipolar worse"  Overview:  Overview:  Failed Chantix "made my bipolar worse"  Last Assessment & Plan:  Advised at length to quit. She failed Chantix.  She prefers to try to quit cold [place].  Past Surgical History:  Procedure Laterality Date   ABDOMINAL HYSTERECTOMY     INCONTINENCE SURGERY     LUNG REMOVAL, PARTIAL     2009    Current Medications: Current Meds  Medication Sig   ALPRAZolam (XANAX) 0.25 MG tablet Take 0.25 mg by mouth every 6 (six) hours as needed for anxiety.   aspirin EC 81 MG tablet Take 81 mg by mouth daily.   atorvastatin (LIPITOR) 80 MG tablet TAKE 1 TABLET (80 MG TOTAL) BY MOUTH DAILY AT 6PM.   citalopram (CELEXA) 40 MG tablet Take 40 mg by mouth daily.   omeprazole (PRILOSEC) 40 MG capsule Take 40 mg by mouth daily.   orphenadrine (NORFLEX) 100 MG tablet Take 100 mg by mouth every 12 (twelve) hours as needed.   prasugrel (EFFIENT) 10 MG TABS tablet Take 10 mg by mouth daily.   pregabalin (LYRICA) 100 MG capsule Take 1 capsule by mouth daily.   valACYclovir (VALTREX) 500 MG tablet Take 500 mg by mouth daily.   [DISCONTINUED] metoprolol succinate (TOPROL-XL) 50 MG 24 hr tablet Take 50 mg by mouth daily.     Allergies:   Morphine; Morphine and related; and Penicillins   Social History   Socioeconomic History   Marital status: Married    Spouse name: Not on file   Number  of children: Not on file   Years of education: Not on file   Highest education level: Not on file  Occupational History   Not on file  Social Needs   Financial resource strain: Not on file   Food insecurity:    Worry: Not on file    Inability: Not on file   Transportation needs:    Medical: Not on file    Non-medical: Not on file  Tobacco Use   Smoking status: Current Every Day Smoker    Packs/day: 1.00    Years: 36.00    Pack years: 36.00    Types: Cigarettes   Smokeless tobacco: Never Used  Substance and Sexual Activity   Alcohol use: Yes    Alcohol/week: 0.0 standard drinks    Comment: occ   Drug use: Not Currently   Sexual activity: Not on file  Lifestyle   Physical activity:    Days per week: Not on file    Minutes per session: Not on file   Stress: Not on file  Relationships   Social connections:    Talks on phone: Not on file    Gets together: Not on file    Attends religious service: Not on file    Active member of club or organization: Not on file    Attends meetings of clubs or organizations: Not on file    Relationship status: Not on file  Other Topics Concern   Not on file  Social History Narrative   Not on file     Family History: The patient's family history includes Cancer in her father; Emphysema in her maternal aunt and mother; Heart attack in her mother; Heart disease in her cousin and paternal grandfather; Hypertension in her paternal uncle.  ROS:   Review of Systems  Constitution: Positive for diaphoresis.  HENT: Negative.   Eyes: Negative.   Cardiovascular: Positive for chest pain and dyspnea on exertion.  Respiratory: Positive for cough, shortness of breath and wheezing.   Endocrine: Negative.   Hematologic/Lymphatic: Negative.   Skin: Negative.   Musculoskeletal: Positive for back pain.  Gastrointestinal: Negative.   Genitourinary: Negative.   Neurological: Positive  for paresthesias.  Psychiatric/Behavioral:  Negative.   Allergic/Immunologic: Negative.    Please see the history of present illness.     All other systems reviewed and are negative.  EKGs/Labs/Other Studies Reviewed:    The following studies were reviewed today:   EKG:  EKG is  ordered today.  The ekg ordered today is personally reviewed and demonstrates [facility] and normal  Recent Labs: No results found for requested labs within last 8760 hours.  Recent Lipid Panel No results found for: CHOL, TRIG, HDL, CHOLHDL, VLDL, LDLCALC, LDLDIRECT  Physical Exam:    VS:  BP 128/78 (BP Location: Left Arm, Patient Position: Sitting, Cuff Size: Normal)    Pulse 76    Ht 5' (1.524 m)    Wt 129 lb (58.5 kg)    SpO2 96%    BMI 25.19 kg/m     Wt Readings from Last 3 Encounters:  05/19/18 129 lb (58.5 kg)  07/26/14 121 lb (54.9 kg)     GEN: anxious breathless audible rhonchi Well nourished, well developed in no acute distress HEENT: Normal NECK: No JVD; No carotid bruits LYMPHATICS: No lymphadenopathy CARDIAC: RRR, no murmurs, rubs, gallops RESPIRATORY:  Diffusely decreased BS diffuse rhonchi and polyphonicwheezing   ABDOMEN: Soft, non-tender, non-distended MUSCULOSKELETAL:  No edema; No deformity  SKIN: Warm and dry NEUROLOGIC:  Alert and oriented x 3 PSYCHIATRIC:  Normal affect     Signed, Shirlee More, MD  05/19/2018 5:30 PM    [place]

## 2018-05-20 LAB — TROPONIN I: Troponin I: <0.01 ng/mL (ref 0.00–0.04)

## 2018-05-20 LAB — COMPREHENSIVE METABOLIC PANEL
ALT: 25 IU/L (ref 0–32)
AST: 14 IU/L (ref 0–40)
Albumin/Globulin Ratio: 1.9 (ref 1.2–2.2)
Albumin: 4.8 g/dL (ref 3.8–4.9)
Alkaline Phosphatase: 106 IU/L (ref 39–117)
BILIRUBIN TOTAL: 0.4 mg/dL (ref 0.0–1.2)
BUN/Creatinine Ratio: 23 (ref 9–23)
BUN: 16 mg/dL (ref 6–24)
CHLORIDE: 100 mmol/L (ref 96–106)
CO2: 26 mmol/L (ref 20–29)
Calcium: 9.8 mg/dL (ref 8.7–10.2)
Creatinine, Ser: 0.69 mg/dL (ref 0.57–1.00)
GFR calc Af Amer: 116 mL/min/{1.73_m2} (ref >59)
GFR calc non Af Amer: 100 mL/min/{1.73_m2} (ref >59)
Globulin, Total: 2.5 g/dL (ref 1.5–4.5)
Glucose: 97 mg/dL (ref 65–99)
Potassium: 4.4 mmol/L (ref 3.5–5.2)
Sodium: 142 mmol/L (ref 134–144)
Total Protein: 7.3 g/dL (ref 6.0–8.5)

## 2018-05-20 LAB — LIPID PANEL
Chol/HDL Ratio: 3.6 ratio (ref 0.0–4.4)
Cholesterol, Total: 186 mg/dL (ref 100–199)
HDL: 51 mg/dL (ref >39)
LDL Calculated: 120 mg/dL — ABNORMAL HIGH (ref 0–99)
Triglycerides: 73 mg/dL (ref 0–149)
VLDL Cholesterol Cal: 15 mg/dL (ref 5–40)

## 2018-05-23 NOTE — Addendum Note (Signed)
Addended by: Stevan Born on: 05/23/2018 11:54 AM   Modules accepted: Orders

## 2018-06-22 DIAGNOSIS — J309 Allergic rhinitis, unspecified: Secondary | ICD-10-CM | POA: Diagnosis not present

## 2018-06-22 DIAGNOSIS — E785 Hyperlipidemia, unspecified: Secondary | ICD-10-CM | POA: Diagnosis not present

## 2018-06-22 DIAGNOSIS — J439 Emphysema, unspecified: Secondary | ICD-10-CM | POA: Diagnosis not present

## 2018-06-22 DIAGNOSIS — R05 Cough: Secondary | ICD-10-CM | POA: Diagnosis not present

## 2018-06-29 DIAGNOSIS — J309 Allergic rhinitis, unspecified: Secondary | ICD-10-CM | POA: Diagnosis not present

## 2018-06-29 DIAGNOSIS — E785 Hyperlipidemia, unspecified: Secondary | ICD-10-CM | POA: Diagnosis not present

## 2018-06-29 DIAGNOSIS — J399 Disease of upper respiratory tract, unspecified: Secondary | ICD-10-CM | POA: Diagnosis not present

## 2018-06-29 DIAGNOSIS — J439 Emphysema, unspecified: Secondary | ICD-10-CM | POA: Diagnosis not present

## 2018-07-06 DIAGNOSIS — I10 Essential (primary) hypertension: Secondary | ICD-10-CM | POA: Diagnosis not present

## 2018-07-06 DIAGNOSIS — E785 Hyperlipidemia, unspecified: Secondary | ICD-10-CM | POA: Diagnosis not present

## 2018-07-06 DIAGNOSIS — J439 Emphysema, unspecified: Secondary | ICD-10-CM | POA: Diagnosis not present

## 2018-07-06 DIAGNOSIS — J309 Allergic rhinitis, unspecified: Secondary | ICD-10-CM | POA: Diagnosis not present

## 2018-07-06 DIAGNOSIS — Z1331 Encounter for screening for depression: Secondary | ICD-10-CM | POA: Diagnosis not present

## 2018-07-21 ENCOUNTER — Encounter: Payer: Self-pay | Admitting: Cardiology

## 2018-07-21 ENCOUNTER — Telehealth: Payer: Self-pay | Admitting: Cardiology

## 2018-07-21 DIAGNOSIS — R0600 Dyspnea, unspecified: Secondary | ICD-10-CM | POA: Diagnosis not present

## 2018-07-21 DIAGNOSIS — J449 Chronic obstructive pulmonary disease, unspecified: Secondary | ICD-10-CM | POA: Diagnosis not present

## 2018-07-21 DIAGNOSIS — I351 Nonrheumatic aortic (valve) insufficiency: Secondary | ICD-10-CM

## 2018-07-21 DIAGNOSIS — F17210 Nicotine dependence, cigarettes, uncomplicated: Secondary | ICD-10-CM | POA: Diagnosis not present

## 2018-07-21 DIAGNOSIS — R05 Cough: Secondary | ICD-10-CM | POA: Diagnosis not present

## 2018-07-21 HISTORY — DX: Nonrheumatic aortic (valve) insufficiency: I35.1

## 2018-07-21 NOTE — Telephone Encounter (Signed)
Returned patient's call, declines rescheduling her 08/05/18 appointment with Dr. Bettina Gavia sooner. Informed that she can call our office should further questions or concerns arise. She verbalizes understanding.

## 2018-07-21 NOTE — Telephone Encounter (Signed)
Left voice message that appt. With Dr. Bettina Gavia could be moved up from June 6th to next week and to call our office if she'd like to reschedule.

## 2018-07-21 NOTE — Telephone Encounter (Signed)
Did he leave a number?  If not please get him on the phone I am in [place]

## 2018-07-21 NOTE — Telephone Encounter (Signed)
Phoned patient who reports very significant shortness of breath with any walking. States she has swelling in left foot and face. She started symbicort 2 puffs am and pm just this morning and does not feel any improvement yet. Also prescribed by her pulmonologist singulair which she has no picked up/started yet. States she has been on these before and they did not help her breathing. She is coughing up a lot of clear secretions. She reports longstanding chest discomfort ratiating to right armpit with walking. She is having panick attacks because of the shortness of breath and is prescribed alprazalam. She does have nitroglycerin rx for CP but has not used it yet. Reviewed instructions for using nitroglycerin for chest discomfort.    Follow-up visit with Dr. Bettina Gavia scheduled 08/05/18.   pls advise, tx

## 2018-07-21 NOTE — Telephone Encounter (Signed)
Earlier today had spoken to her pulmonary physician she has severe underlying COPD and if her symptoms do not respond or worsen may require hospital care.  She can move her appointment up with me if she wants to she could see me in the office and we could do an EKG but I think that this is due to her underlying COPD

## 2018-07-21 NOTE — Telephone Encounter (Signed)
Dr Alcide Clever would like you to call him about this pt when you get a chance

## 2018-07-21 NOTE — Telephone Encounter (Signed)
Patient called to schedule followup and is still having shortness of breath and cough. Pulmonology put her on Singular and Cymbacort inhaler but she says this is different. Please advise.

## 2018-08-04 NOTE — Progress Notes (Signed)
Office visit  Date:  08/05/2018   ID:  Tami Zamora, DOB 1965/05/12, MRN [id number]  PCP:  Cher Nakai, MD  Cardiologist:  Shirlee More, MD  Electrophysiologist:  None   Evaluation Performed:  Follow-Up Visit  Chief Complaint: Increasing shortness of breath and exercise intolerance  History of Present Illness:    Tami Zamora is a 53 y.o. female with CAD and COPD last seen 05/19/18.  The patient does not have symptoms concerning for COVID-19 infection (fever, chills, cough, or new shortness of breath).   Review of records in epic seen by pulmonary 2016 history of cigarette smoking COPD and lung cancer stage Ia with left upper lobectomy.  Other problems include hypertension hyperlipidemia hypothyroidism and bipolar affective disorder.   She is not doing well since recent inferior MI PCI and stent left circumflex marginal branch 05/10/18.  She is having pain in the right upper extremity arterial access in the tracks her arterial course and she has extensive ecchymosis but a good pulse.  Her COPD is clearly worsened she is quite bronchospastic and short of breath with activity and she is unsure whether the tightness in her chest is cardiac or underlying COPD she was placed on a beta-blocker.  With the emergency of her PCI and stent she never had a chest x-ray she has a nonproductive cough she is having back pain that is pleuritic in nature and she thinks she may have had a fever she is diaphoretic at nighttime.  She has been unimproved using her bronchodilator at home as well as aerosol.  She also has had chest tightness with her exertional shortness of breath but is not the same as her presentation to the hospital with acute MI.  Her EKG in my office today is normal.  She complains of back pain worse with a deep breath and radiates up into her neck.  Overall she feels weak and debilitated.  She has been compliant with her medications including dual antiplatelet without interruption  Is a  difficult case in general she feels increasingly weak and shortness of breath and cannot do physical effort she tells me about oxygen her sats drop as low as 81% she feels better when she wears oxygen does not check her sats with physical effort when she does activity her heart racing she is short of breath and her legs feel weak.  She is not having angina but today she had back pain between the shoulder blades and radiating down the right arm.  She was seen by pulmonary she acknowledges that she has significant COPD but tells me that her lung function is improved compared to a year ago and she does not believe that her underlying lung disease is the mechanism of her exercise intolerance and shortness of breath.  She has been on prednisone and pregabalin takes a calcium channel blocker and has noted edema.  She is concerned she has heart failure rather go ahead and check an echocardiogram and proBNP level and with her complaints of leg weakness and pain screen her for peripheral vascular disease with segmental pressures ABIs and pulse volume recording.  I will see her back in my office afterwards and I told her she may require diagnostic cardiac catheterization.  In the end I do not think this is cardiac in etiology.  After her last visit she told me to call Dr. Carren Rang he had done an echocardiogram in his office with moderate aortic regurgitation.  I looked in her records from [facility] she had an echocardiogram done recently which showed normal ventricular volume function and mild aortic regurgitation.  Past Medical History:  Diagnosis Date  . Adenocarcinoma of lung, stage 1 ([place]) 11/28/2007   2009 Left upper lobectomy: 1. LUNG, LEFT UPPER LOBE, SEGMENTAL RESECTION: - INVASIVE MODERATELY DIFFERENTIATED ADENOCARCINOMA, 2.0 CM, WITH FOCAL VISCERAL PLEURAL INVOLVEMENT. - NO ANGIOLYMPHATIC INVASION IDENTIFIED. - RESECTION MARGIN IS NEGATIVE FOR NEOPLASM. - FOUR HILAR LYMPH NODES, NEGATIVE FOR  METASTATIC CARCINOMA   Overview:  Overview:  2009 Left upper lobectomy: 1. LUNG, LEFT UPPER  . Bipolar affective disorder ([place])   . Centrilobular emphysema, COPD 01/05/2008   May 2016 simple spirometry FEV1 0.87 L (36% predicted)   Overview:  Overview:  May 2016 simple spirometry FEV1 0.87 L (36% predicted)  Last Assessment & Plan:  COPD: GOLD Grade C Combined recommendations from the [place], [employer], Investment banker, corporate, [place] ([place], Ann Intern Med. 2011;155(3):179) recommends   . Chest pain 05/19/2018  . Chronic hypoxemic respiratory failure (HCC) 07/26/2014   2L qHS and with exertoin  Overview:  Overview:  2L qHS and with exertoin  Last Assessment & Plan:  We will start 2 L daily at bedtime and with exertion. Portable oxygen concentrator ordered.  . Chronic obstructive pulmonary disease with acute exacerbation ([place]) 12/08/2014  . COPD (chronic obstructive pulmonary disease) ([place])   . Dizzy 05/19/2018  . Hyperlipidemia   . Hypertension   . Hypothyroidism   . Lung cancer ([place])    dx 2009  . Major depressive disorder without psychotic features 12/08/2014  . Numbness in feet 05/19/2018  . Tobacco abuse 07/26/2014   Failed Chantix "made my bipolar worse"  Overview:  Overview:  Failed Chantix "made my bipolar worse"  Last Assessment & Plan:  Advised at length to quit. She failed Chantix.  She prefers to try to quit cold [place].   Past Surgical History:  Procedure Laterality Date  . ABDOMINAL HYSTERECTOMY    . INCONTINENCE SURGERY    . LUNG REMOVAL, PARTIAL     2009     Current Meds  Medication Sig  . ALPRAZolam (XANAX) 0.25 MG tablet Take 0.25 mg by mouth every 6 (six) hours as needed for anxiety.  Marland Kitchen aspirin EC 81 MG tablet Take 81 mg by mouth daily.  Marland Kitchen atorvastatin (LIPITOR) 80 MG tablet TAKE 1 TABLET (80 MG TOTAL) BY MOUTH DAILY AT 6PM.  . citalopram (CELEXA) 40 MG tablet Take 40 mg by mouth daily.   Marland Kitchen diltiazem (CARDIZEM CD) 240 MG 24 hr capsule Take 1 capsule (240 mg total) by mouth daily.  . furosemide (LASIX) 20 MG tablet Take 20 mg by mouth daily.  . isosorbide mononitrate (IMDUR) 30 MG 24 hr tablet Take 1 tablet (30 mg total) by mouth daily.  . montelukast (SINGULAIR) 10 MG tablet Take 10 mg by mouth daily.  . nitroGLYCERIN (NITROSTAT) 0.4 MG SL tablet Place 1 tablet (0.4 mg total) under the tongue every 5 (five) minutes as needed for chest pain.  Marland Kitchen omeprazole (PRILOSEC) 40 MG capsule Take 40 mg by mouth daily.  . orphenadrine (NORFLEX) 100 MG tablet Take 100 mg by mouth every 12 (twelve) hours as needed.  . prasugrel (EFFIENT) 10 MG TABS tablet Take 10 mg by mouth daily.  . pregabalin (LYRICA) 100 MG capsule Take 1 capsule by mouth daily.  . valACYclovir (VALTREX) 500 MG tablet Take 500 mg by mouth daily.  Allergies:   Morphine; Morphine and related; and Penicillins   Social History   Tobacco Use  . Smoking status: Current Every Day Smoker    Packs/day: 1.00    Years: 36.00    Pack years: 36.00    Types: Cigarettes  . Smokeless tobacco: Never Used  Substance Use Topics  . Alcohol use: Yes    Alcohol/week: 0.0 standard drinks    Comment: occ  . Drug use: Not Currently     Family Hx: The patient's family history includes Cancer in her father; Emphysema in her maternal aunt and mother; Heart attack in her mother; Heart disease in her cousin and paternal grandfather; Hypertension in her paternal uncle.  ROS:   Please see the history of present illness.     All other systems reviewed and are negative.   Prior CV studies:   The following studies were reviewed today:    Labs/Other Tests and Data Reviewed:    EKG:  Today SRTH normal independently reviewed  Recent Labs: 05/19/2018: ALT 25; BUN 16; Creatinine, Ser 0.69; Potassium 4.4; Sodium 142   Recent Lipid Panel Lab Results  Component Value Date/Time   CHOL 186 05/19/2018 05:15 PM   TRIG 73 05/19/2018  05:15 PM   HDL 51 05/19/2018 05:15 PM   CHOLHDL 3.6 05/19/2018 05:15 PM   LDLCALC 120 (H) 05/19/2018 05:15 PM    Wt Readings from Last 3 Encounters:  08/05/18 130 lb (59 kg)  05/19/18 129 lb (58.5 kg)  07/26/14 121 lb (54.9 kg)     Objective:    Vital Signs:  BP (!) 146/70 (BP Location: Right Arm, Patient Position: Sitting, Cuff Size: Normal)   Pulse (!) 112   Temp 98.4 F (36.9 C)   Wt 130 lb (59 kg)   SpO2 94%   BMI 25.39 kg/m    VITAL SIGNS:  reviewed GEN:  no acute distress EYES:  sclerae anicteric, EOMI - Extraocular Movements Intact RESPIRATORY:  normal respiratory effort, symmetric expansion CARDIOVASCULAR:  no peripheral edema SKIN:  no rash, lesions or ulcers. MUSCULOSKELETAL:  no obvious deformities. NEURO:  alert and oriented x 3, no obvious focal deficit PSYCH:  normal affect She is barrel chested has a COPD habitus diffusely decreased breath sounds distant heart sounds regular rhythm I cannot auscultate a murmur and she has no examination edema for me ASSESSMENT & PLAN:    1. CAD stable continue dual antiplatelet therapy lipid-lowering and if we cannot resolve her symptoms of shortness of breath and exercise intolerance may be forced to do diagnostic cardiac catheterization. 2. COPD with chronic respiratory failure I believe this is her primary problem she does not was given opinion by pulmonary that she has cardiac issues that are predominant.  Further evaluation as outlined and encouraged her to wear her oxygen continuously 3. Hypertension stable continue current treatment 4. Hyperlipidemia stable check a lipid profile liver function 5. Aortic regurgitation recheck echocardiogram proBNP level  COVID-19 Education: The signs and symptoms of COVID-19 were discussed with the patient and how to seek care for testing (follow up with PCP or arrange E-visit).  The importance of social distancing was discussed today.  Time:   Today, I have spent 25 minutes with the  patient with telehealth technology discussing the above problems.     Medication Adjustments/Labs and Tests Ordered: Current medicines are reviewed at length with the patient today.  Concerns regarding medicines are outlined above.   Tests Ordered: No orders of the defined types were placed  in this encounter.   Medication Changes: No orders of the defined types were placed in this encounter.   Disposition:  Follow up in 4 week(s)  Signed, Shirlee More, MD  08/05/2018 1:46 PM    [facility]

## 2018-08-05 ENCOUNTER — Encounter: Payer: Self-pay | Admitting: Cardiology

## 2018-08-05 ENCOUNTER — Ambulatory Visit (INDEPENDENT_AMBULATORY_CARE_PROVIDER_SITE_OTHER): Payer: BC Managed Care – PPO | Admitting: Cardiology

## 2018-08-05 ENCOUNTER — Other Ambulatory Visit: Payer: Self-pay

## 2018-08-05 VITALS — BP 146/70 | HR 112 | Temp 98.4°F | Wt 130.0 lb

## 2018-08-05 DIAGNOSIS — J9611 Chronic respiratory failure with hypoxia: Secondary | ICD-10-CM

## 2018-08-05 DIAGNOSIS — J449 Chronic obstructive pulmonary disease, unspecified: Secondary | ICD-10-CM | POA: Diagnosis not present

## 2018-08-05 DIAGNOSIS — I10 Essential (primary) hypertension: Secondary | ICD-10-CM

## 2018-08-05 DIAGNOSIS — R0600 Dyspnea, unspecified: Secondary | ICD-10-CM | POA: Diagnosis not present

## 2018-08-05 DIAGNOSIS — F17210 Nicotine dependence, cigarettes, uncomplicated: Secondary | ICD-10-CM | POA: Diagnosis not present

## 2018-08-05 DIAGNOSIS — J301 Allergic rhinitis due to pollen: Secondary | ICD-10-CM | POA: Diagnosis not present

## 2018-08-05 DIAGNOSIS — E782 Mixed hyperlipidemia: Secondary | ICD-10-CM

## 2018-08-05 DIAGNOSIS — I351 Nonrheumatic aortic (valve) insufficiency: Secondary | ICD-10-CM

## 2018-08-05 DIAGNOSIS — R05 Cough: Secondary | ICD-10-CM | POA: Diagnosis not present

## 2018-08-05 NOTE — Patient Instructions (Signed)
Medication Instructions:  Your physician recommends that you continue on your current medications as directed. Please refer to the Current Medication list given to you today.  If you need a refill on your cardiac medications before your next appointment, please call your pharmacy.   Lab work: Your physician recommends that you return for lab work today: ProBNP, CMP, lipid panel.   If you have labs (blood work) drawn today and your tests are completely normal, you will receive your results only by: Marland Kitchen MyChart Message (if you have MyChart) OR . A paper copy in the mail If you have any lab test that is abnormal or we need to change your treatment, we will call you to review the results.  Testing/Procedures: You had an EKG today.   Your physician has requested that you have an echocardiogram. Echocardiography is a painless test that uses sound waves to create images of your heart. It provides your doctor with information about the size and shape of your heart and how well your heart's chambers and valves are working. This procedure takes approximately one hour. There are no restrictions for this procedure.  Your physician has requested that you have an ankle brachial index (ABI). During this test an ultrasound and blood pressure cuff are used to evaluate the arteries that supply the arms and legs with blood. Allow thirty minutes for this exam. There are no restrictions or special instructions.  Follow-Up: At [facility], you and your health needs are our priority.  As part of our continuing mission to provide you with exceptional heart care, we have created designated Provider Care Teams.  These Care Teams include your primary Cardiologist (physician) and Advanced Practice Providers (APPs -  Physician Assistants and Nurse Practitioners) who all work together to provide you with the care you need, when you need it. You will need a follow up appointment in 4 weeks.        Echocardiogram An  echocardiogram is a procedure that uses painless sound waves (ultrasound) to produce an image of the heart. Images from an echocardiogram can provide important information about:  Signs of coronary artery disease (CAD).  Aneurysm detection. An aneurysm is a weak or damaged part of an artery wall that bulges out from the normal force of blood pumping through the body.  Heart size and shape. Changes in the size or shape of the heart can be associated with certain conditions, including heart failure, aneurysm, and CAD.  Heart muscle function.  Heart valve function.  Signs of a past heart attack.  Fluid buildup around the heart.  Thickening of the heart muscle.  A tumor or infectious growth around the heart valves. Tell a health care provider about:  Any allergies you have.  All medicines you are taking, including vitamins, herbs, eye drops, creams, and over-the-counter medicines.  Any blood disorders you have.  Any surgeries you have had.  Any medical conditions you have.  Whether you are pregnant or may be pregnant. What are the risks? Generally, this is a safe procedure. However, problems may occur, including:  Allergic reaction to dye (contrast) that may be used during the procedure. What happens before the procedure? No specific preparation is needed. You may eat and drink normally. What happens during the procedure?   An IV tube may be inserted into one of your veins.  You may receive contrast through this tube. A contrast is an injection that improves the quality of the pictures from your heart.  A gel will  be applied to your chest.  A wand-like tool (transducer) will be moved over your chest. The gel will help to transmit the sound waves from the transducer.  The sound waves will harmlessly bounce off of your heart to allow the heart images to be captured in real-time motion. The images will be recorded on a computer. The procedure may vary among health care  providers and hospitals. What happens after the procedure?  You may return to your normal, everyday life, including diet, activities, and medicines, unless your health care provider tells you not to do that. Summary  An echocardiogram is a procedure that uses painless sound waves (ultrasound) to produce an image of the heart.  Images from an echocardiogram can provide important information about the size and shape of your heart, heart muscle function, heart valve function, and fluid buildup around your heart.  You do not need to do anything to prepare before this procedure. You may eat and drink normally.  After the echocardiogram is completed, you may return to your normal, everyday life, unless your health care provider tells you not to do that. This information is not intended to replace advice given to you by your health care provider. Make sure you discuss any questions you have with your health care provider. Document Released: 02/14/2000 Document Revised: 03/21/2016 Document Reviewed: 03/21/2016 Elsevier Interactive Patient Education  2019 [place] Index Test Why am I having this test? The ankle-brachial index (ABI) test is used to diagnose peripheral vascular disease (PVD). PVD is also known as peripheral arterial disease (PAD). PVD is the blocking or hardening of the arteries anywhere within the circulatory system beyond the heart. PVD is caused by:  Cholesterol deposits in your blood vessels (atherosclerosis). This is the most common cause of this condition.  Irritation and swelling (inflammation) in the blood vessels.  Blood clots in the vessels. Cholesterol deposits cause arteries to narrow. Normal delivery of oxygen to your tissues is affected, causing muscle pain and fatigue. This is called claudication. PVD means that there may also be a buildup of cholesterol:  In your heart. This increases the risk of heart attacks.  In your brain. This  increases the risk of strokes. What is being tested? The ankle-brachial index test measures the blood flow in your arms and legs. The blood flow will show if blood vessels in your legs have been narrowed by cholesterol deposits. How do I prepare for this test?  Wear loose clothing.  Do not use any tobacco products, including cigarettes, chewing tobacco, or e-cigarettes, for at least 30 minutes before the test. What happens during the test?  1. You will lie down in a resting position. 2. Your health care provider will use a blood pressure machine and a small ultrasound device (Doppler) to measure the systolic pressures on your upper arms and ankles. Systolic pressure is the pressure inside your arteries when your heart pumps. 3. Systolic pressure measurements will be taken several times, and at several points, on both the ankle and the arm. 4. Your health care provider will divide the highest systolic pressure of the ankle by the highest systolic pressure of the arm. The result is the ankle-brachial pressure ratio, or ABI. Sometimes this test will be repeated after you have exercised on a treadmill for 5 minutes. You may have leg pain during the exercise portion of the test if you suffer from PVD. If the index number drops after exercise, this may show that PVD  is present. How are the results reported? Your test results will be reported as a value that shows the ratio of your ankle pressure to your arm pressure (ABI ratio). Your health care provider will compare your results to normal ranges that were established after testing a large group of people (reference ranges). Reference ranges may vary among labs and hospitals. For this test, a common reference range is:  ABI ratio of 0.9 to 1.3. What do the results mean? An ABI ratio that is below the reference range is considered abnormal and may indicate PVD in the legs. Talk with your health care provider about what your results mean. Questions to  ask your health care provider Ask your health care provider, or the department that is doing the test:  When will my results be ready?  How will I get my results?  What are my treatment options?  What other tests do I need?  What are my next steps? Summary  The ankle-brachial index (ABI) test is used to diagnose peripheral vascular disease (PVD). PVD is also known as peripheral arterial disease (PAD).  The ankle-brachial index test measures the blood flow in your arms and legs.  The highest systolic pressure of the ankle is divided by the highest systolic pressure of the arm. The result is the ABI ratio.  An ABI ratio that is below 0.9 is considered abnormal and may indicate PVD in the legs. This information is not intended to replace advice given to you by your health care provider. Make sure you discuss any questions you have with your health care provider. Document Released: 02/21/2004 Document Revised: 11/10/2016 Document Reviewed: 11/10/2016 Elsevier Interactive Patient Education  [employer].

## 2018-08-06 LAB — LIPID PANEL
Chol/HDL Ratio: 2.5 ratio (ref 0.0–4.4)
Cholesterol, Total: 140 mg/dL (ref 100–199)
HDL: 55 mg/dL (ref >39)
LDL Calculated: 68 mg/dL (ref 0–99)
Triglycerides: 83 mg/dL (ref 0–149)
VLDL Cholesterol Cal: 17 mg/dL (ref 5–40)

## 2018-08-06 LAB — COMPREHENSIVE METABOLIC PANEL
ALT: 18 IU/L (ref 0–32)
AST: 20 IU/L (ref 0–40)
Albumin/Globulin Ratio: 2 (ref 1.2–2.2)
Albumin: 4.5 g/dL (ref 3.8–4.9)
Alkaline Phosphatase: 92 IU/L (ref 39–117)
BUN/Creatinine Ratio: 17 (ref 9–23)
BUN: 12 mg/dL (ref 6–24)
Bilirubin Total: 0.7 mg/dL (ref 0.0–1.2)
CO2: 25 mmol/L (ref 20–29)
Calcium: 8.9 mg/dL (ref 8.7–10.2)
Chloride: 95 mmol/L — ABNORMAL LOW (ref 96–106)
Creatinine, Ser: 0.7 mg/dL (ref 0.57–1.00)
GFR calc Af Amer: 115 mL/min/{1.73_m2} (ref >59)
GFR calc non Af Amer: 100 mL/min/{1.73_m2} (ref >59)
Globulin, Total: 2.3 g/dL (ref 1.5–4.5)
Glucose: 98 mg/dL (ref 65–99)
Potassium: 3.6 mmol/L (ref 3.5–5.2)
Sodium: 139 mmol/L (ref 134–144)
Total Protein: 6.8 g/dL (ref 6.0–8.5)

## 2018-08-06 LAB — PRO B NATRIURETIC PEPTIDE: NT-Pro BNP: 63 pg/mL (ref 0–249)

## 2018-08-08 ENCOUNTER — Telehealth: Payer: Self-pay | Admitting: *Deleted

## 2018-08-08 MED ORDER — POTASSIUM CHLORIDE ER 10 MEQ PO TBCR: 10.0000 meq | EXTENDED_RELEASE_TABLET | Freq: Every day | ORAL | 1 refills | Status: DC

## 2018-08-08 MED ORDER — TORSEMIDE 20 MG PO TABS: 20.0000 mg | ORAL_TABLET | Freq: Every day | ORAL | 2 refills | Status: DC

## 2018-08-08 NOTE — Telephone Encounter (Signed)
-----   Message from Richardo Priest, MD sent at 08/06/2018  7:18 AM EDT ----- Good result   Bnp is very low. Does not appear to be heart failure  Stop furosemide.     Start torsemide 20 mh day and k tab 10 meq day

## 2018-08-08 NOTE — Telephone Encounter (Signed)
Telephone call to patient. Informed of lab results and medication changes. Patient verbalized understanding.

## 2018-08-08 NOTE — Telephone Encounter (Signed)
Left message for patient to return call to discuss lab results.

## 2018-08-09 ENCOUNTER — Telehealth: Payer: Self-pay | Admitting: Cardiology

## 2018-08-09 NOTE — Telephone Encounter (Signed)
Left message on patient's VM explaining her ABI and ECHO had to be rescheduled to 09/08/2018 at 2:15 and 3:15 due to a scheduling error

## 2018-08-11 ENCOUNTER — Other Ambulatory Visit: Payer: Self-pay | Admitting: Cardiology

## 2018-08-16 ENCOUNTER — Other Ambulatory Visit: Payer: Medicare Other

## 2018-08-23 DIAGNOSIS — Z79899 Other long term (current) drug therapy: Secondary | ICD-10-CM | POA: Diagnosis not present

## 2018-08-23 DIAGNOSIS — Z7951 Long term (current) use of inhaled steroids: Secondary | ICD-10-CM | POA: Diagnosis not present

## 2018-08-23 DIAGNOSIS — I252 Old myocardial infarction: Secondary | ICD-10-CM | POA: Diagnosis not present

## 2018-08-23 DIAGNOSIS — Z90710 Acquired absence of both cervix and uterus: Secondary | ICD-10-CM | POA: Diagnosis not present

## 2018-08-23 DIAGNOSIS — Z85118 Personal history of other malignant neoplasm of bronchus and lung: Secondary | ICD-10-CM | POA: Diagnosis not present

## 2018-08-23 DIAGNOSIS — Z20828 Contact with and (suspected) exposure to other viral communicable diseases: Secondary | ICD-10-CM | POA: Diagnosis not present

## 2018-08-23 DIAGNOSIS — Z955 Presence of coronary angioplasty implant and graft: Secondary | ICD-10-CM | POA: Diagnosis not present

## 2018-08-23 DIAGNOSIS — Z7902 Long term (current) use of antithrombotics/antiplatelets: Secondary | ICD-10-CM | POA: Diagnosis not present

## 2018-08-23 DIAGNOSIS — R0600 Dyspnea, unspecified: Secondary | ICD-10-CM | POA: Diagnosis not present

## 2018-08-23 DIAGNOSIS — E039 Hypothyroidism, unspecified: Secondary | ICD-10-CM | POA: Diagnosis not present

## 2018-08-23 DIAGNOSIS — K5900 Constipation, unspecified: Secondary | ICD-10-CM | POA: Diagnosis not present

## 2018-08-23 DIAGNOSIS — E785 Hyperlipidemia, unspecified: Secondary | ICD-10-CM | POA: Diagnosis not present

## 2018-08-23 DIAGNOSIS — B009 Herpesviral infection, unspecified: Secondary | ICD-10-CM | POA: Diagnosis not present

## 2018-08-23 DIAGNOSIS — R51 Headache: Secondary | ICD-10-CM | POA: Diagnosis not present

## 2018-08-23 DIAGNOSIS — Z6825 Body mass index (BMI) 25.0-25.9, adult: Secondary | ICD-10-CM | POA: Diagnosis not present

## 2018-08-23 DIAGNOSIS — F17210 Nicotine dependence, cigarettes, uncomplicated: Secondary | ICD-10-CM | POA: Diagnosis not present

## 2018-08-23 DIAGNOSIS — Z902 Acquired absence of lung [part of]: Secondary | ICD-10-CM | POA: Diagnosis not present

## 2018-08-23 DIAGNOSIS — I2510 Atherosclerotic heart disease of native coronary artery without angina pectoris: Secondary | ICD-10-CM | POA: Diagnosis not present

## 2018-08-23 DIAGNOSIS — D509 Iron deficiency anemia, unspecified: Secondary | ICD-10-CM | POA: Diagnosis not present

## 2018-08-23 DIAGNOSIS — F319 Bipolar disorder, unspecified: Secondary | ICD-10-CM | POA: Diagnosis not present

## 2018-08-23 DIAGNOSIS — J432 Centrilobular emphysema: Secondary | ICD-10-CM | POA: Diagnosis not present

## 2018-08-23 DIAGNOSIS — K219 Gastro-esophageal reflux disease without esophagitis: Secondary | ICD-10-CM | POA: Diagnosis not present

## 2018-08-23 DIAGNOSIS — I10 Essential (primary) hypertension: Secondary | ICD-10-CM | POA: Diagnosis not present

## 2018-08-23 DIAGNOSIS — R05 Cough: Secondary | ICD-10-CM | POA: Diagnosis not present

## 2018-08-23 DIAGNOSIS — Z9981 Dependence on supplemental oxygen: Secondary | ICD-10-CM | POA: Diagnosis not present

## 2018-08-23 DIAGNOSIS — D649 Anemia, unspecified: Secondary | ICD-10-CM | POA: Diagnosis not present

## 2018-08-23 DIAGNOSIS — J9611 Chronic respiratory failure with hypoxia: Secondary | ICD-10-CM | POA: Diagnosis not present

## 2018-08-23 DIAGNOSIS — Z7982 Long term (current) use of aspirin: Secondary | ICD-10-CM | POA: Diagnosis not present

## 2018-08-23 DIAGNOSIS — R0602 Shortness of breath: Secondary | ICD-10-CM | POA: Diagnosis not present

## 2018-08-23 DIAGNOSIS — G8929 Other chronic pain: Secondary | ICD-10-CM | POA: Diagnosis not present

## 2018-08-23 DIAGNOSIS — R079 Chest pain, unspecified: Secondary | ICD-10-CM | POA: Diagnosis not present

## 2018-08-25 MED ORDER — POLYETHYLENE GLYCOL 3350 17 G PO PACK
17.00 | PACK | ORAL | Status: DC
Start: 2018-08-25 — End: 2018-08-25

## 2018-08-25 MED ORDER — AZITHROMYCIN 250 MG PO TABS
250.00 | ORAL_TABLET | ORAL | Status: DC
Start: ? — End: 2018-08-25

## 2018-08-25 MED ORDER — GENERIC EXTERNAL MEDICATION
Status: DC
Start: ? — End: 2018-08-25

## 2018-08-25 MED ORDER — VALACYCLOVIR HCL 500 MG PO TABS
500.00 | ORAL_TABLET | ORAL | Status: DC
Start: 2018-08-25 — End: 2018-08-25

## 2018-08-25 MED ORDER — ATORVASTATIN CALCIUM 40 MG PO TABS
80.00 | ORAL_TABLET | ORAL | Status: DC
Start: 2018-08-25 — End: 2018-08-25

## 2018-08-25 MED ORDER — PANTOPRAZOLE SODIUM 40 MG PO TBEC
40.00 | DELAYED_RELEASE_TABLET | ORAL | Status: DC
Start: 2018-08-24 — End: 2018-08-25

## 2018-08-25 MED ORDER — ASPIRIN EC 81 MG PO TBEC
81.00 | DELAYED_RELEASE_TABLET | ORAL | Status: DC
Start: 2018-08-25 — End: 2018-08-25

## 2018-08-25 MED ORDER — ISOSORBIDE MONONITRATE ER 30 MG PO TB24
30.00 | ORAL_TABLET | ORAL | Status: DC
Start: 2018-08-25 — End: 2018-08-25

## 2018-08-25 MED ORDER — ALPRAZOLAM 0.5 MG PO TABS
0.25 | ORAL_TABLET | ORAL | Status: DC
Start: ? — End: 2018-08-25

## 2018-08-25 MED ORDER — NICOTINE 21 MG/24HR TD PT24
1.00 | MEDICATED_PATCH | TRANSDERMAL | Status: DC
Start: 2018-08-25 — End: 2018-08-25

## 2018-08-25 MED ORDER — BUSPIRONE HCL 5 MG PO TABS
10.00 | ORAL_TABLET | ORAL | Status: DC
Start: 2018-08-24 — End: 2018-08-25

## 2018-08-25 MED ORDER — DILTIAZEM HCL ER COATED BEADS 240 MG PO CP24
240.00 | ORAL_CAPSULE | ORAL | Status: DC
Start: 2018-08-25 — End: 2018-08-25

## 2018-08-25 MED ORDER — MONTELUKAST SODIUM 10 MG PO TABS
10.00 | ORAL_TABLET | ORAL | Status: DC
Start: 2018-08-25 — End: 2018-08-25

## 2018-08-25 MED ORDER — ALBUTEROL SULFATE (2.5 MG/3ML) 0.083% IN NEBU
2.50 | INHALATION_SOLUTION | RESPIRATORY_TRACT | Status: DC
Start: ? — End: 2018-08-25

## 2018-08-25 MED ORDER — GENERIC EXTERNAL MEDICATION
2.00 | Status: DC
Start: ? — End: 2018-08-25

## 2018-08-25 MED ORDER — ALUMINUM-MAGNESIUM-SIMETHICONE 200-200-20 MG/5ML PO SUSP
30.00 | ORAL | Status: DC
Start: ? — End: 2018-08-25

## 2018-08-25 MED ORDER — PRASUGREL HCL 10 MG PO TABS
10.00 | ORAL_TABLET | ORAL | Status: DC
Start: 2018-08-25 — End: 2018-08-25

## 2018-08-25 MED ORDER — ACETAMINOPHEN 325 MG PO TABS
650.00 | ORAL_TABLET | ORAL | Status: DC
Start: ? — End: 2018-08-25

## 2018-08-25 MED ORDER — METOPROLOL SUCCINATE ER 50 MG PO TB24
50.00 | ORAL_TABLET | ORAL | Status: DC
Start: 2018-08-25 — End: 2018-08-25

## 2018-08-25 MED ORDER — MELATONIN 3 MG PO TABS
3.00 | ORAL_TABLET | ORAL | Status: DC
Start: ? — End: 2018-08-25

## 2018-08-25 MED ORDER — PREGABALIN 100 MG PO CAPS
100.00 | ORAL_CAPSULE | ORAL | Status: DC
Start: 2018-08-25 — End: 2018-08-25

## 2018-09-05 DIAGNOSIS — J439 Emphysema, unspecified: Secondary | ICD-10-CM | POA: Diagnosis not present

## 2018-09-05 DIAGNOSIS — J208 Acute bronchitis due to other specified organisms: Secondary | ICD-10-CM | POA: Diagnosis not present

## 2018-09-05 DIAGNOSIS — F17200 Nicotine dependence, unspecified, uncomplicated: Secondary | ICD-10-CM | POA: Diagnosis not present

## 2018-09-05 DIAGNOSIS — I10 Essential (primary) hypertension: Secondary | ICD-10-CM | POA: Diagnosis not present

## 2018-09-05 DIAGNOSIS — D649 Anemia, unspecified: Secondary | ICD-10-CM | POA: Diagnosis not present

## 2018-09-05 DIAGNOSIS — B9689 Other specified bacterial agents as the cause of diseases classified elsewhere: Secondary | ICD-10-CM | POA: Diagnosis not present

## 2018-09-05 DIAGNOSIS — E039 Hypothyroidism, unspecified: Secondary | ICD-10-CM | POA: Diagnosis not present

## 2018-09-06 ENCOUNTER — Other Ambulatory Visit: Payer: Self-pay | Admitting: Cardiology

## 2018-09-08 ENCOUNTER — Ambulatory Visit (INDEPENDENT_AMBULATORY_CARE_PROVIDER_SITE_OTHER): Payer: Medicare Other

## 2018-09-08 ENCOUNTER — Other Ambulatory Visit: Payer: Self-pay

## 2018-09-08 DIAGNOSIS — I10 Essential (primary) hypertension: Secondary | ICD-10-CM | POA: Diagnosis not present

## 2018-09-08 DIAGNOSIS — J9611 Chronic respiratory failure with hypoxia: Secondary | ICD-10-CM

## 2018-09-08 DIAGNOSIS — I351 Nonrheumatic aortic (valve) insufficiency: Secondary | ICD-10-CM

## 2018-09-08 DIAGNOSIS — E782 Mixed hyperlipidemia: Secondary | ICD-10-CM | POA: Diagnosis not present

## 2018-09-08 NOTE — Progress Notes (Unsigned)
Complete echocardiogram has been performed.  Jimmy Howie Rufus RDCS, RVT 

## 2018-09-08 NOTE — Progress Notes (Signed)
ABI exam has been performed and was normal  [employer], RVT

## 2018-09-09 ENCOUNTER — Other Ambulatory Visit: Payer: Self-pay | Admitting: Cardiology

## 2018-09-09 ENCOUNTER — Ambulatory Visit: Payer: Medicare Other | Admitting: Cardiology

## 2018-09-09 NOTE — Progress Notes (Deleted)
Cardiology Office Note:    Date:  09/09/2018   ID:  Tami Zamora, DOB 07-02-1965, MRN [id number]  PCP:  Cher Nakai, MD  Cardiologist:  Shirlee More, MD    Referring MD: Cher Nakai, MD    ASSESSMENT:    1. CAD in native artery   2. Nonrheumatic aortic valve insufficiency   3. Essential hypertension   4. Mixed hyperlipidemia   5. Other iron deficiency anemia   6. Chronic obstructive pulmonary disease with acute exacerbation (HCC)    PLAN:    In order of problems listed above:  1. ***   Next appointment: ***   Medication Adjustments/Labs and Tests Ordered: Current medicines are reviewed at length with the patient today.  Concerns regarding medicines are outlined above.  No orders of the defined types were placed in this encounter.  No orders of the defined types were placed in this encounter.   No chief complaint on file.   History of Present Illness:    Tami Zamora is a 53 y.o. female with a hx of CAD and COPD  last seen 08/05/2018. Review of records in epic seen by pulmonary 2016 history of cigarette smoking COPD and lung cancer stage Ia with left upper lobectomy.  Other problems include hypertension hyperlipidemia hypothyroidism and bipolar affective disorder.  She was subsequently found to be anemic with a hemoglobin 5.6 and was given transfusion along with parenteral iron.  She was seen in the emergency room at [facility] 08/23/2018 for anemia and COPD exacerbation.  Review of records show that her proBNP level was low not consistent with heart failure 140 her troponin was undetectable.  CMP was normal GFR greater than 90 cc potassium 3.7 Her chest x-ray showed chronic emphysematous changes pulmonary scarring and postoperative changes involving the left hemithorax.  The EKG was interpreted as sinus rhythm and normal 08/25/2018.  Compliance with diet, lifestyle and medications: *** Past Medical History:  Diagnosis Date  . Adenocarcinoma of lung, stage 1  ([place]) 11/28/2007   2009 Left upper lobectomy: 1. LUNG, LEFT UPPER LOBE, SEGMENTAL RESECTION: - INVASIVE MODERATELY DIFFERENTIATED ADENOCARCINOMA, 2.0 CM, WITH FOCAL VISCERAL PLEURAL INVOLVEMENT. - NO ANGIOLYMPHATIC INVASION IDENTIFIED. - RESECTION MARGIN IS NEGATIVE FOR NEOPLASM. - FOUR HILAR LYMPH NODES, NEGATIVE FOR METASTATIC CARCINOMA   Overview:  Overview:  2009 Left upper lobectomy: 1. LUNG, LEFT UPPER  . Bipolar affective disorder ([place])   . Centrilobular emphysema, COPD 01/05/2008   May 2016 simple spirometry FEV1 0.87 L (36% predicted)   Overview:  Overview:  May 2016 simple spirometry FEV1 0.87 L (36% predicted)  Last Assessment & Plan:  COPD: GOLD Grade C Combined recommendations from the [place], [employer] of Chest Physicians, Investment banker, corporate, [place] ([place], Ann Intern Med. 2011;155(3):179) recommends   . Chest pain 05/19/2018  . Chronic hypoxemic respiratory failure (HCC) 07/26/2014   2L qHS and with exertoin  Overview:  Overview:  2L qHS and with exertoin  Last Assessment & Plan:  We will start 2 L daily at bedtime and with exertion. Portable oxygen concentrator ordered.  . Chronic obstructive pulmonary disease with acute exacerbation ([place]) 12/08/2014  . COPD (chronic obstructive pulmonary disease) ([place])   . Dizzy 05/19/2018  . Hyperlipidemia   . Hypertension   . Hypothyroidism   . Lung cancer ([place])    dx 2009  . Major depressive disorder without psychotic features 12/08/2014  . Numbness in feet 05/19/2018  . Tobacco abuse 07/26/2014  Failed Chantix "made my bipolar worse"  Overview:  Overview:  Failed Chantix "made my bipolar worse"  Last Assessment & Plan:  Advised at length to quit. She failed Chantix.  She prefers to try to quit cold [place].    Past Surgical History:  Procedure Laterality Date  . ABDOMINAL HYSTERECTOMY    . INCONTINENCE SURGERY    . LUNG REMOVAL, PARTIAL     2009    Current  Medications: No outpatient medications have been marked as taking for the 09/09/18 encounter (Appointment) with Richardo Priest, MD.     Allergies:   Morphine, Morphine and related, and Penicillins   Social History   Socioeconomic History  . Marital status: Married    Spouse name: Not on file  . Number of children: Not on file  . Years of education: Not on file  . Highest education level: Not on file  Occupational History  . Not on file  Social Needs  . Financial resource strain: Not on file  . Food insecurity    Worry: Not on file    Inability: Not on file  . Transportation needs    Medical: Not on file    Non-medical: Not on file  Tobacco Use  . Smoking status: Current Every Day Smoker    Packs/day: 1.00    Years: 36.00    Pack years: 36.00    Types: Cigarettes  . Smokeless tobacco: Never Used  Substance and Sexual Activity  . Alcohol use: Yes    Alcohol/week: 0.0 standard drinks    Comment: occ  . Drug use: Not Currently  . Sexual activity: Not on file  Lifestyle  . Physical activity    Days per week: Not on file    Minutes per session: Not on file  . Stress: Not on file  Relationships  . Social Herbalist on phone: Not on file    Gets together: Not on file    Attends religious service: Not on file    Active member of club or organization: Not on file    Attends meetings of clubs or organizations: Not on file    Relationship status: Not on file  Other Topics Concern  . Not on file  Social History Narrative  . Not on file     Family History: The patient's ***family history includes Cancer in her father; Emphysema in her maternal aunt and mother; Heart attack in her mother; Heart disease in her cousin and paternal grandfather; Hypertension in her paternal uncle. ROS:   Please see the history of present illness.    All other systems reviewed and are negative.  EKGs/Labs/Other Studies Reviewed:    The following studies were reviewed today:   EKG:  EKG ordered today and personally reviewed.  The ekg ordered today demonstrates   Echo 09/08/2018:    1. The left ventricle has normal systolic function with an ejection fraction of 60-65%. The cavity size was normal. There is mild concentric left ventricular hypertrophy. Left ventricular diastolic Doppler parameters are consistent with  pseudonormalization.  2. The right ventricle has normal systolic function. The cavity was normal. There is no increase in right ventricular wall thickness.  3. No evidence of mitral valve stenosis.  4. The aortic valve is not well visualized and has an indeterminate number of cusps. Aortic valve regurgitation is mild by color flow Doppler.  5. The aortic root and ascending aorta are normal in size and structure.  ABI 09/08/2018:  Summary: Right: Resting right ankle-brachial index is within normal range. No evidence of significant right lower extremity arterial disease. The right toe-brachial index is normal. Left: Resting left ankle-brachial index is within normal range. No evidence of significant left lower extremity arterial disease. The left toe-brachial index is normal.  Recent Labs:   08/05/2018: ALT 18; BUN 12; Creatinine, Ser 0.70; NT-Pro BNP 63; Potassium 3.6; Sodium 139  Recent Lipid Panel    Component Value Date/Time   CHOL 140 08/05/2018 1422   TRIG 83 08/05/2018 1422   HDL 55 08/05/2018 1422   CHOLHDL 2.5 08/05/2018 1422   LDLCALC 68 08/05/2018 1422    Physical Exam:    VS:  There were no vitals taken for this visit.    Wt Readings from Last 3 Encounters:  08/05/18 130 lb (59 kg)  05/19/18 129 lb (58.5 kg)  07/26/14 121 lb (54.9 kg)     GEN: *** Well nourished, well developed in no acute distress HEENT: Normal NECK: No JVD; No carotid bruits LYMPHATICS: No lymphadenopathy CARDIAC: ***RRR, no murmurs, rubs, gallops RESPIRATORY:  Clear to auscultation without rales, wheezing or rhonchi  ABDOMEN: Soft, non-tender,  non-distended MUSCULOSKELETAL:  No edema; No deformity  SKIN: Warm and dry NEUROLOGIC:  Alert and oriented x 3 PSYCHIATRIC:  Normal affect    Signed, Shirlee More, MD  09/09/2018 7:59 AM    [place]

## 2018-09-13 DIAGNOSIS — E785 Hyperlipidemia, unspecified: Secondary | ICD-10-CM | POA: Diagnosis not present

## 2018-09-13 DIAGNOSIS — D509 Iron deficiency anemia, unspecified: Secondary | ICD-10-CM | POA: Diagnosis not present

## 2018-09-13 DIAGNOSIS — F17200 Nicotine dependence, unspecified, uncomplicated: Secondary | ICD-10-CM | POA: Diagnosis not present

## 2018-09-13 DIAGNOSIS — J439 Emphysema, unspecified: Secondary | ICD-10-CM | POA: Diagnosis not present

## 2018-09-13 DIAGNOSIS — M25551 Pain in right hip: Secondary | ICD-10-CM | POA: Diagnosis not present

## 2018-09-20 DIAGNOSIS — H25813 Combined forms of age-related cataract, bilateral: Secondary | ICD-10-CM | POA: Diagnosis not present

## 2018-10-05 ENCOUNTER — Other Ambulatory Visit: Payer: Self-pay | Admitting: Cardiology

## 2018-10-05 NOTE — Telephone Encounter (Signed)
Diltiazem and Isosorbide refills sent to [id number]. Overdue for appt

## 2018-10-07 DIAGNOSIS — J439 Emphysema, unspecified: Secondary | ICD-10-CM | POA: Diagnosis not present

## 2018-10-07 DIAGNOSIS — F17200 Nicotine dependence, unspecified, uncomplicated: Secondary | ICD-10-CM | POA: Diagnosis not present

## 2018-10-07 DIAGNOSIS — J309 Allergic rhinitis, unspecified: Secondary | ICD-10-CM | POA: Diagnosis not present

## 2018-10-07 DIAGNOSIS — D509 Iron deficiency anemia, unspecified: Secondary | ICD-10-CM | POA: Diagnosis not present

## 2018-10-07 DIAGNOSIS — E785 Hyperlipidemia, unspecified: Secondary | ICD-10-CM | POA: Diagnosis not present

## 2018-10-13 DIAGNOSIS — K921 Melena: Secondary | ICD-10-CM | POA: Diagnosis not present

## 2018-10-13 DIAGNOSIS — D500 Iron deficiency anemia secondary to blood loss (chronic): Secondary | ICD-10-CM | POA: Diagnosis not present

## 2018-10-17 DIAGNOSIS — K921 Melena: Secondary | ICD-10-CM | POA: Diagnosis not present

## 2018-10-28 DIAGNOSIS — J439 Emphysema, unspecified: Secondary | ICD-10-CM | POA: Diagnosis not present

## 2018-10-28 DIAGNOSIS — J309 Allergic rhinitis, unspecified: Secondary | ICD-10-CM | POA: Diagnosis not present

## 2018-10-28 DIAGNOSIS — C3412 Malignant neoplasm of upper lobe, left bronchus or lung: Secondary | ICD-10-CM | POA: Diagnosis not present

## 2018-10-28 DIAGNOSIS — F17200 Nicotine dependence, unspecified, uncomplicated: Secondary | ICD-10-CM | POA: Diagnosis not present

## 2018-10-28 DIAGNOSIS — E785 Hyperlipidemia, unspecified: Secondary | ICD-10-CM | POA: Diagnosis not present

## 2018-10-31 DIAGNOSIS — Z1211 Encounter for screening for malignant neoplasm of colon: Secondary | ICD-10-CM | POA: Diagnosis not present

## 2018-10-31 DIAGNOSIS — R195 Other fecal abnormalities: Secondary | ICD-10-CM | POA: Diagnosis not present

## 2018-10-31 DIAGNOSIS — Z86010 Personal history of colonic polyps: Secondary | ICD-10-CM | POA: Diagnosis not present

## 2018-10-31 DIAGNOSIS — D500 Iron deficiency anemia secondary to blood loss (chronic): Secondary | ICD-10-CM | POA: Diagnosis not present

## 2018-10-31 DIAGNOSIS — Z791 Long term (current) use of non-steroidal anti-inflammatories (NSAID): Secondary | ICD-10-CM | POA: Diagnosis not present

## 2018-10-31 DIAGNOSIS — K921 Melena: Secondary | ICD-10-CM | POA: Diagnosis not present

## 2018-11-03 DIAGNOSIS — Z8719 Personal history of other diseases of the digestive system: Secondary | ICD-10-CM | POA: Diagnosis not present

## 2018-11-03 DIAGNOSIS — R109 Unspecified abdominal pain: Secondary | ICD-10-CM | POA: Diagnosis not present

## 2018-11-03 DIAGNOSIS — K921 Melena: Secondary | ICD-10-CM | POA: Diagnosis not present

## 2018-11-03 DIAGNOSIS — K6389 Other specified diseases of intestine: Secondary | ICD-10-CM | POA: Diagnosis not present

## 2018-11-08 DIAGNOSIS — R195 Other fecal abnormalities: Secondary | ICD-10-CM | POA: Diagnosis not present

## 2018-11-08 DIAGNOSIS — D500 Iron deficiency anemia secondary to blood loss (chronic): Secondary | ICD-10-CM | POA: Diagnosis not present

## 2018-11-08 DIAGNOSIS — R110 Nausea: Secondary | ICD-10-CM | POA: Diagnosis not present

## 2018-11-24 DIAGNOSIS — R110 Nausea: Secondary | ICD-10-CM | POA: Diagnosis not present

## 2018-11-25 DIAGNOSIS — E785 Hyperlipidemia, unspecified: Secondary | ICD-10-CM | POA: Diagnosis not present

## 2018-11-25 DIAGNOSIS — J309 Allergic rhinitis, unspecified: Secondary | ICD-10-CM | POA: Diagnosis not present

## 2018-11-25 DIAGNOSIS — F17200 Nicotine dependence, unspecified, uncomplicated: Secondary | ICD-10-CM | POA: Diagnosis not present

## 2018-11-25 DIAGNOSIS — C3412 Malignant neoplasm of upper lobe, left bronchus or lung: Secondary | ICD-10-CM | POA: Diagnosis not present

## 2018-11-25 DIAGNOSIS — D509 Iron deficiency anemia, unspecified: Secondary | ICD-10-CM | POA: Diagnosis not present

## 2018-11-25 DIAGNOSIS — J439 Emphysema, unspecified: Secondary | ICD-10-CM | POA: Diagnosis not present

## 2018-11-29 ENCOUNTER — Other Ambulatory Visit: Payer: Self-pay | Admitting: Cardiology

## 2018-11-29 MED ORDER — PRASUGREL HCL 10 MG PO TABS: 10.0000 mg | ORAL_TABLET | Freq: Every day | ORAL | 0 refills | Status: DC

## 2018-11-29 NOTE — Telephone Encounter (Signed)
°  NOTE:  Pharmacy Change  1. Which medications need to be refilled? (please list name of each medication and dose if known) prasugrel (EFFIENT) 10 MG TABS tablet  2. Which pharmacy/location (including street and city if local pharmacy) is medication to be sent to? [address] 3. Do they need a 30 day or 90 day supply? 90 day

## 2018-11-29 NOTE — Telephone Encounter (Signed)
Effient sent to pharmacy #[id number] to [facility] as patient no showed last appointment in July.

## 2018-12-08 DIAGNOSIS — R195 Other fecal abnormalities: Secondary | ICD-10-CM | POA: Diagnosis not present

## 2018-12-13 DIAGNOSIS — K5900 Constipation, unspecified: Secondary | ICD-10-CM | POA: Diagnosis not present

## 2018-12-13 DIAGNOSIS — R110 Nausea: Secondary | ICD-10-CM | POA: Diagnosis not present

## 2018-12-13 DIAGNOSIS — R195 Other fecal abnormalities: Secondary | ICD-10-CM | POA: Diagnosis not present

## 2018-12-13 DIAGNOSIS — D500 Iron deficiency anemia secondary to blood loss (chronic): Secondary | ICD-10-CM | POA: Diagnosis not present

## 2018-12-23 DIAGNOSIS — J0100 Acute maxillary sinusitis, unspecified: Secondary | ICD-10-CM | POA: Diagnosis not present

## 2018-12-23 DIAGNOSIS — F17200 Nicotine dependence, unspecified, uncomplicated: Secondary | ICD-10-CM | POA: Diagnosis not present

## 2018-12-23 DIAGNOSIS — K219 Gastro-esophageal reflux disease without esophagitis: Secondary | ICD-10-CM | POA: Diagnosis not present

## 2018-12-23 DIAGNOSIS — J309 Allergic rhinitis, unspecified: Secondary | ICD-10-CM | POA: Diagnosis not present

## 2018-12-23 DIAGNOSIS — J439 Emphysema, unspecified: Secondary | ICD-10-CM | POA: Diagnosis not present

## 2019-01-02 DIAGNOSIS — J439 Emphysema, unspecified: Secondary | ICD-10-CM | POA: Diagnosis not present

## 2019-01-02 DIAGNOSIS — C3412 Malignant neoplasm of upper lobe, left bronchus or lung: Secondary | ICD-10-CM | POA: Diagnosis not present

## 2019-01-02 DIAGNOSIS — J309 Allergic rhinitis, unspecified: Secondary | ICD-10-CM | POA: Diagnosis not present

## 2019-01-02 DIAGNOSIS — F17200 Nicotine dependence, unspecified, uncomplicated: Secondary | ICD-10-CM | POA: Diagnosis not present

## 2019-01-02 DIAGNOSIS — J0100 Acute maxillary sinusitis, unspecified: Secondary | ICD-10-CM | POA: Diagnosis not present

## 2019-01-03 DIAGNOSIS — R195 Other fecal abnormalities: Secondary | ICD-10-CM | POA: Diagnosis not present

## 2019-01-19 DIAGNOSIS — J309 Allergic rhinitis, unspecified: Secondary | ICD-10-CM | POA: Diagnosis not present

## 2019-01-19 DIAGNOSIS — I10 Essential (primary) hypertension: Secondary | ICD-10-CM | POA: Diagnosis not present

## 2019-01-19 DIAGNOSIS — F17200 Nicotine dependence, unspecified, uncomplicated: Secondary | ICD-10-CM | POA: Diagnosis not present

## 2019-01-19 DIAGNOSIS — J439 Emphysema, unspecified: Secondary | ICD-10-CM | POA: Diagnosis not present

## 2019-01-19 DIAGNOSIS — E785 Hyperlipidemia, unspecified: Secondary | ICD-10-CM | POA: Diagnosis not present

## 2019-01-19 DIAGNOSIS — D509 Iron deficiency anemia, unspecified: Secondary | ICD-10-CM | POA: Diagnosis not present

## 2019-01-19 DIAGNOSIS — M1990 Unspecified osteoarthritis, unspecified site: Secondary | ICD-10-CM | POA: Diagnosis not present

## 2019-01-19 DIAGNOSIS — C3412 Malignant neoplasm of upper lobe, left bronchus or lung: Secondary | ICD-10-CM | POA: Diagnosis not present

## 2019-02-02 DIAGNOSIS — R519 Headache, unspecified: Secondary | ICD-10-CM | POA: Diagnosis not present

## 2019-02-02 DIAGNOSIS — J309 Allergic rhinitis, unspecified: Secondary | ICD-10-CM | POA: Diagnosis not present

## 2019-02-02 DIAGNOSIS — C3412 Malignant neoplasm of upper lobe, left bronchus or lung: Secondary | ICD-10-CM | POA: Diagnosis not present

## 2019-02-02 DIAGNOSIS — J439 Emphysema, unspecified: Secondary | ICD-10-CM | POA: Diagnosis not present

## 2019-04-07 DIAGNOSIS — I2510 Atherosclerotic heart disease of native coronary artery without angina pectoris: Secondary | ICD-10-CM | POA: Diagnosis not present

## 2019-04-07 DIAGNOSIS — S20212A Contusion of left front wall of thorax, initial encounter: Secondary | ICD-10-CM | POA: Diagnosis not present

## 2019-04-07 DIAGNOSIS — R0781 Pleurodynia: Secondary | ICD-10-CM | POA: Diagnosis not present

## 2019-04-07 DIAGNOSIS — I252 Old myocardial infarction: Secondary | ICD-10-CM | POA: Diagnosis not present

## 2019-04-07 DIAGNOSIS — R071 Chest pain on breathing: Secondary | ICD-10-CM | POA: Diagnosis not present

## 2019-04-07 DIAGNOSIS — J449 Chronic obstructive pulmonary disease, unspecified: Secondary | ICD-10-CM | POA: Diagnosis not present

## 2019-04-07 DIAGNOSIS — Z955 Presence of coronary angioplasty implant and graft: Secondary | ICD-10-CM | POA: Diagnosis not present

## 2019-04-07 DIAGNOSIS — I10 Essential (primary) hypertension: Secondary | ICD-10-CM | POA: Diagnosis not present

## 2019-04-07 DIAGNOSIS — E785 Hyperlipidemia, unspecified: Secondary | ICD-10-CM | POA: Diagnosis not present

## 2019-04-13 ENCOUNTER — Telehealth: Payer: Self-pay | Admitting: Cardiology

## 2019-04-13 NOTE — Telephone Encounter (Signed)
Pt called to report that she had fallen last week in the bathroom not syncopal related, and went to the ED and the urgent care for rib pain. She was treated with muscle relaxers and pain medication. She says the muscle relaxers are not helping and the pain seems to be reoccurring with exertion not movement. She took a nitroglycerin yesterday and it relieved the pain, but she still has a dull ache in the rib area where she hit the toilet when she fell.   Pt is anxious that she is also having anginal pain. She wanted to go over her med list and she is still taking everything except her diltiazem which for some reason was declined by our office. Pt was last seen by Dr. Bettina Gavia 08/2018. Her Imdur was also declined, but refilled by her PCP.   I advised the pt that she needs to be seen. She is pain free today and I advised her if it returns or worsens to reconsider going back to the ED. I will work on getting her an appointment in [place] her preference this afternoon or tomorrow.

## 2019-04-13 NOTE — Telephone Encounter (Signed)
We will Dr. Harriet Masson see her in the [facility] office tomorrow

## 2019-04-13 NOTE — Telephone Encounter (Signed)
Patient would like to go over her medication list with the nurse.

## 2019-04-13 NOTE — Telephone Encounter (Signed)
Called patient scheduled her with Dr. Harriet Masson tomorrow.

## 2019-04-14 ENCOUNTER — Encounter: Payer: Self-pay | Admitting: Cardiology

## 2019-04-14 ENCOUNTER — Other Ambulatory Visit: Payer: Self-pay | Admitting: Cardiology

## 2019-04-14 ENCOUNTER — Ambulatory Visit (INDEPENDENT_AMBULATORY_CARE_PROVIDER_SITE_OTHER): Payer: Medicare Other | Admitting: Cardiology

## 2019-04-14 ENCOUNTER — Other Ambulatory Visit: Payer: Self-pay

## 2019-04-14 VITALS — BP 140/84 | HR 99 | Ht 60.0 in | Wt 137.0 lb

## 2019-04-14 DIAGNOSIS — M79604 Pain in right leg: Secondary | ICD-10-CM

## 2019-04-14 DIAGNOSIS — R079 Chest pain, unspecified: Secondary | ICD-10-CM

## 2019-04-14 DIAGNOSIS — I25118 Atherosclerotic heart disease of native coronary artery with other forms of angina pectoris: Secondary | ICD-10-CM

## 2019-04-14 DIAGNOSIS — Z720 Tobacco use: Secondary | ICD-10-CM | POA: Diagnosis not present

## 2019-04-14 DIAGNOSIS — M79605 Pain in left leg: Secondary | ICD-10-CM

## 2019-04-14 DIAGNOSIS — I10 Essential (primary) hypertension: Secondary | ICD-10-CM

## 2019-04-14 HISTORY — DX: Pain in right leg: M79.604

## 2019-04-14 HISTORY — DX: Pain in right leg: M79.605

## 2019-04-14 MED ORDER — PRASUGREL HCL 10 MG PO TABS: 10.0000 mg | ORAL_TABLET | Freq: Every day | ORAL | 1 refills | Status: DC

## 2019-04-14 MED ORDER — DILTIAZEM HCL ER COATED BEADS 240 MG PO CP24: 240.0000 mg | ORAL_CAPSULE | Freq: Every day | ORAL | 1 refills | Status: AC

## 2019-04-14 MED ORDER — ISOSORBIDE MONONITRATE ER 30 MG PO TB24: ORAL_TABLET | ORAL | 1 refills | Status: AC

## 2019-04-14 NOTE — Progress Notes (Signed)
Cardiology Office Note:    Date:  04/14/2019   ID:  Tami Zamora, DOB 1965-07-15, MRN [id number]  PCP:  Tami Nakai, MD  Cardiologist:  Tami More, MD  Electrophysiologist:  None   Referring MD: Tami Nakai, MD   Follow up visit for chest pain.  History of Present Illness:    Tami Zamora is a 54 y.o. female with CAD status post PCI to the left circumflex marginal branch on May 10, 2018, hypertension, hyperlipidemia, tobacco use, aortic regurgitation recently noted as mild, Hypothyroidism and bipolar affective disorder.   The patient last saw Dr. Bettina Zamora on 08/05/2018 at that time he continued on her dual antiplatelet therapy and wanted to monitor her symptoms for further diagnostic testing.  Patient is here for follow-up visit today.  She tells me that she does have baseline shortness of breath to her COPD however recently has gotten significantly worse.  In addition she is here Friday she was experiencing left-sided chest pressure with shortness of breath which fell exactly like when she had a heart attack in 2020.  She did go to urgent care and she was told that she was fine and she went to the ED at [facility] because she did not think that she was fine at that time she was ported that she did blood work and she was discharged home.  Given the persistence of the pain she called our office yesterday requested to be seen sooner.  In addition she expresses some leg pain and discoloration in her leg when she walks.  Especially in her toes looking cyanotic.  Of note the patient has not followed since June 2020 and she stopped taking her Effient.  Past Medical History:  Diagnosis Date  . Adenocarcinoma of lung, stage 1 ([place]) 11/28/2007   2009 Left upper lobectomy: 1. LUNG, LEFT UPPER LOBE, SEGMENTAL RESECTION: - INVASIVE MODERATELY DIFFERENTIATED ADENOCARCINOMA, 2.0 CM, WITH FOCAL VISCERAL PLEURAL INVOLVEMENT. - NO ANGIOLYMPHATIC INVASION IDENTIFIED. - RESECTION MARGIN IS  NEGATIVE FOR NEOPLASM. - FOUR HILAR LYMPH NODES, NEGATIVE FOR METASTATIC CARCINOMA   Overview:  Overview:  2009 Left upper lobectomy: 1. LUNG, LEFT UPPER  . Bipolar affective disorder ([place])   . Centrilobular emphysema, COPD 01/05/2008   May 2016 simple spirometry FEV1 0.87 L (36% predicted)   Overview:  Overview:  May 2016 simple spirometry FEV1 0.87 L (36% predicted)  Last Assessment & Plan:  COPD: GOLD Grade C Combined recommendations from the [place], [employer] Physicians, Investment banker, corporate, [place] ([place], Ann Intern Med. 2011;155(3):179) recommends   . Chest pain 05/19/2018  . Chronic hypoxemic respiratory failure (HCC) 07/26/2014   2L qHS and with exertoin  Overview:  Overview:  2L qHS and with exertoin  Last Assessment & Plan:  We will start 2 L daily at bedtime and with exertion. Portable oxygen concentrator ordered.  . Chronic obstructive pulmonary disease with acute exacerbation ([place]) 12/08/2014  . COPD (chronic obstructive pulmonary disease) ([place])   . Dizzy 05/19/2018  . Hyperlipidemia   . Hypertension   . Hypothyroidism   . Lung cancer ([place])    dx 2009  . Major depressive disorder without psychotic features 12/08/2014  . Numbness in feet 05/19/2018  . Tobacco abuse 07/26/2014   Failed Chantix "made my bipolar worse"  Overview:  Overview:  Failed Chantix "made my bipolar worse"  Last Assessment & Plan:  Advised at length to quit. She failed Chantix.  She prefers to  try to quit cold [place].    Past Surgical History:  Procedure Laterality Date  . ABDOMINAL HYSTERECTOMY    . INCONTINENCE SURGERY    . LUNG REMOVAL, PARTIAL     2009    Current Medications: Current Meds  Medication Sig  . albuterol (VENTOLIN HFA) 108 (90 Base) MCG/ACT inhaler Inhale into the lungs.  . ALPRAZolam (XANAX) 0.25 MG tablet Take 0.25 mg by mouth every 6 (six) hours as needed for anxiety.  Marland Kitchen aspirin EC 81 MG tablet Take 81 mg by  mouth daily.  Marland Kitchen atorvastatin (LIPITOR) 80 MG tablet TAKE 1 TABLET (80 MG TOTAL) BY MOUTH DAILY AT 6PM.  . isosorbide mononitrate (IMDUR) 30 MG 24 hr tablet **NO REFILLS UNTIL SEEN IN OFFICE**  . methocarbamol (ROBAXIN) 750 MG tablet Take 750 mg by mouth 3 (three) times daily.  . nitroGLYCERIN (NITROSTAT) 0.4 MG SL tablet Place 1 tablet (0.4 mg total) under the tongue every 5 (five) minutes as needed for chest pain.  Marland Kitchen omeprazole (PRILOSEC) 40 MG capsule Take 40 mg by mouth daily.  . pregabalin (LYRICA) 100 MG capsule Take 1 capsule by mouth daily.  . valACYclovir (VALTREX) 500 MG tablet Take 500 mg by mouth daily.  . [DISCONTINUED] isosorbide mononitrate (IMDUR) 30 MG 24 hr tablet **NO REFILLS UNTIL SEEN IN OFFICE**     Allergies:   Morphine, Morphine and related, and Penicillins   Social History   Socioeconomic History  . Marital status: Married    Spouse name: Not on file  . Number of children: Not on file  . Years of education: Not on file  . Highest education level: Not on file  Occupational History  . Not on file  Tobacco Use  . Smoking status: Current Every Day Smoker    Packs/day: 1.00    Years: 36.00    Pack years: 36.00    Types: Cigarettes  . Smokeless tobacco: Never Used  Substance and Sexual Activity  . Alcohol use: Yes    Alcohol/week: 0.0 standard drinks    Comment: occ  . Drug use: Not Currently  . Sexual activity: Not on file  Other Topics Concern  . Not on file  Social History Narrative  . Not on file   Social Determinants of Health   Financial Resource Strain:   . Difficulty of Paying Living Expenses: Not on file  Food Insecurity:   . Worried About Charity fundraiser in the Last Year: Not on file  . Ran Out of Food in the Last Year: Not on file  Transportation Needs:   . Lack of Transportation (Medical): Not on file  . Lack of Transportation (Non-Medical): Not on file  Physical Activity:   . Days of Exercise per Week: Not on file  . Minutes of  Exercise per Session: Not on file  Stress:   . Feeling of Stress : Not on file  Social Connections:   . Frequency of Communication with Friends and Family: Not on file  . Frequency of Social Gatherings with Friends and Family: Not on file  . Attends Religious Services: Not on file  . Active Member of Clubs or Organizations: Not on file  . Attends Archivist Meetings: Not on file  . Marital Status: Not on file     Family History: The patient's family history includes Cancer in her father; Emphysema in her maternal aunt and mother; Heart attack in her mother; Heart disease in her cousin and paternal grandfather; Hypertension in her  paternal uncle.  ROS:   Review of Systems  Constitution: Negative for decreased appetite, fever and weight gain.  HENT: Negative for congestion, ear discharge, hoarse voice and sore throat.   Eyes: Negative for discharge, redness, vision loss in right eye and visual halos.  Cardiovascular: Negative for chest pain, dyspnea on exertion, leg swelling, orthopnea and palpitations.  Respiratory: Negative for cough, hemoptysis, shortness of breath and snoring.   Endocrine: Negative for heat intolerance and polyphagia.  Hematologic/Lymphatic: Negative for bleeding problem. Does not bruise/bleed easily.  Skin: Negative for flushing, nail changes, rash and suspicious lesions.  Musculoskeletal: Negative for arthritis, joint pain, muscle cramps, myalgias, neck pain and stiffness.  Gastrointestinal: Negative for abdominal pain, bowel incontinence, diarrhea and excessive appetite.  Genitourinary: Negative for decreased libido, genital sores and incomplete emptying.  Neurological: Negative for brief paralysis, focal weakness, headaches and loss of balance.  Psychiatric/Behavioral: Negative for altered mental status, depression and suicidal ideas.  Allergic/Immunologic: Negative for HIV exposure and persistent infections.    EKGs/Labs/Other Studies Reviewed:     The following studies were reviewed today:   EKG:  The ekg ordered today demonstrates sinus rhythm, heart rate 90 bpm Q waves in inferior leads suggesting old inferior wall infarction.  Transthoracic echocardiogram September 08, 2018 IMPRESSIONS  1. The left ventricle has normal systolic function with an ejection  fraction of 60-65%. The cavity size was normal. There is mild concentric  left ventricular hypertrophy. Left ventricular diastolic Doppler  parameters are consistent with  pseudonormalization.  2. The right ventricle has normal systolic function. The cavity was  normal. There is no increase in right ventricular wall thickness.  3. No evidence of mitral valve stenosis.  4. The aortic valve is not well visualized and has an indeterminate  number of cusps. Aortic valve regurgitation is mild by color flow Doppler.  5. The aortic root and ascending aorta are normal in size and structure.    Vascular ABI on 09/08/2018 Summary:  Right: Resting right ankle-brachial index is within normal range. No  evidence of significant right lower extremity arterial disease. The right  toe-brachial index is normal.   Left: Resting left ankle-brachial index is within normal range. No  evidence of significant left lower extremity arterial disease. The left  toe-brachial index is normal.    Recent Labs: 08/05/2018: ALT 18; BUN 12; Creatinine, Ser 0.70; NT-Pro BNP 63; Potassium 3.6; Sodium 139  Recent Lipid Panel    Component Value Date/Time   CHOL 140 08/05/2018 1422   TRIG 83 08/05/2018 1422   HDL 55 08/05/2018 1422   CHOLHDL 2.5 08/05/2018 1422   LDLCALC 68 08/05/2018 1422    Physical Exam:    VS:  BP 140/84 (BP Location: Right Arm, Patient Position: Sitting, Cuff Size: Normal)   Pulse 99   Ht 5' (1.524 m)   Wt 137 lb (62.1 kg)   SpO2 93%   BMI 26.76 kg/m     Wt Readings from Last 3 Encounters:  04/14/19 137 lb (62.1 kg)  08/05/18 130 lb (59 kg)  05/19/18 129 lb (58.5 kg)      GEN: Well nourished, well developed in no acute distress HEENT: Normal NECK: No JVD; No carotid bruits LYMPHATICS: No lymphadenopathy CARDIAC: S1S2 noted,RRR, no murmurs, rubs, gallops RESPIRATORY:  Clear to auscultation without rales, wheezing or rhonchi  ABDOMEN: Soft, non-tender, non-distended, +bowel sounds, no guarding. EXTREMITIES: No edema, cyanotic/blue toes  no clubbing MUSCULOSKELETAL:  No deformity  SKIN: Warm and dry NEUROLOGIC:  Alert  and oriented x 3, non-focal PSYCHIATRIC:  Normal affect, good insight  ASSESSMENT:    1. Chest pain, unspecified type   2. Coronary artery disease of native artery of native heart with stable angina pectoris ([place])   3. Essential hypertension   4. Tobacco use   5. Pain in both lower extremities    PLAN:    Her symptoms are concerning and getting worse at this time is appropriate to proceed a left heart catheterization to understand the progression of her coronary artery disease.  In this high risk patient even a negative stress test will not be able to rule out any progression.  I have discussed with the patient in great detail and she is willing to proceed.  In the meantime I am going to restart her Effient therefore she will be taking aspirin 81 mg current and Effient 10 mg daily.  She does take lovastatin 80 mg daily she will continue this. She has Imdur 30 mg daily as well.  The patient understands that risks include but are not limited to stroke (1 in 1000), death (1 in 24), kidney failure [usually temporary] (1 in 500), bleeding (1 in 200), allergic reaction [possibly serious] (1 in 200), and agrees to proceed.   For her leg pain I was able to review her resting ABIs which was normal.  But I think proceeding with exercise ABI with be highly appropriate in this patient.  She is agreeable to proceed with this testing.  Blood work will be performed today which would include BMP, mag as well as troponin.  The patient was counseled on  tobacco cessation today for 5 minutes.  Counseling included reviewing the risks of smoking tobacco products, how it impacts the patient's current medical diagnoses and different strategies for quitting.  Pharmacotherapy to aid in tobacco cessation was not prescribed today. The patient coordinate with  primary care provider.  The patient was also advised to call   1-800-QUIT-NOW (805)572-2699) for additional help with quitting smoking.  The patient is in agreement with the above plan. The patient left the office in stable condition.  The patient will follow up in 1 month or sooner if needed   Medication Adjustments/Labs and Tests Ordered: Current medicines are reviewed at length with the patient today.  Concerns regarding medicines are outlined above.  Orders Placed This Encounter  Procedures  . Basic Metabolic Panel (BMET)  . Magnesium  . CBC  . Troponin T  . EKG 12-Lead   Meds ordered this encounter  Medications  . isosorbide mononitrate (IMDUR) 30 MG 24 hr tablet    Sig: **NO REFILLS UNTIL SEEN IN OFFICE**    Dispense:  90 tablet    Refill:  1  . diltiazem (CARDIZEM CD) 240 MG 24 hr capsule    Sig: Take 1 capsule (240 mg total) by mouth daily.    Dispense:  90 capsule    Refill:  1  . prasugrel (EFFIENT) 10 MG TABS tablet    Sig: Take 1 tablet (10 mg total) by mouth daily.    Dispense:  90 tablet    Refill:  1    Patient Instructions  Medication Instructions:  Your physician has recommended you make the following change in your medication:  RESTART: Cardizem CD(diltiazem) 240 mg TAke 1 tab daily RESTART: Imdur (isosorbide) 30 mg TAke 1 tabe daily RESTART: Effient(prasugrel) 10 mg Take 1 tab daily   *If you need a refill on your cardiac medications before your next appointment, please  call your pharmacy*  Lab Work: Your physician recommends that you return for lab work in: TODAY BMP,CBC,Magnesium,Troponin T  If you have labs (blood work) drawn today and your tests are  completely normal, you will receive your results only by: Marland Kitchen MyChart Message (if you have MyChart) OR . A paper copy in the mail If you have any lab test that is abnormal or we need to change your treatment, we will call you to review the results.  Testing/Procedures:    [place] [id number] Dept: [phone] Loc: [phone]  ARLINE KETTER  04/14/2019  You are scheduled for a Cardiac Catheterization on Thursday, February 18 with Dr. [name].  1. Please arrive at the [facility] (Main Entrance A) at [facility]: [address] [place], [place] [address] at 5:30 AM (This time is two hours before your procedure to ensure your preparation). Free valet parking service is available.   Special note: Every effort is made to have your procedure done on time. Please understand that emergencies sometimes delay scheduled procedures.  2. Diet: Do not eat solid foods after midnight.  The patient may have clear liquids until 5am upon the day of the procedure.  3. Labs: You will need to have a COVID screen 3 days prior to procedure.Your appt. Is on Mon 04/17/19 at 12:25 PM at [place]   4. Medication instructions in preparation for your procedure:   Contrast Allergy: No  On the morning of your procedure, take your Aspirin and any morning medicines NOT listed above.  You may use sips of water.  5. Plan for one night stay--bring personal belongings. 6. Bring a current list of your medications and current insurance cards. 7. You MUST have a responsible person to drive you home. 8. Someone MUST be with you the first 24 hours after you arrive home or your discharge will be delayed. 9. Please wear clothes that are easy to get on and off and wear slip-on shoes.  Thank you for allowing [place] to care for you!   -- [facility]  services   Follow-Up: At [facility], you and your health needs are our priority.  As part of our continuing mission to provide you with exceptional heart care, we have created designated Provider Care Teams.  These Care Teams include your primary Cardiologist (physician) and Advanced Practice Providers (APPs -  Physician Assistants and Nurse Practitioners) who all work together to provide you with the care you need, when you need it.  Your next appointment:   1 month(s)  The format for your next appointment:   In Person  Provider:   Berniece Salines, DO or Tami More MD  Other Instructions  YOU WILL BE CALLED ABOUT AN EXERCISE ABI TO EVALUATE LOWER LEGS/TOES. SOMEONE WILL CALL YOU WITH AN APPOINTMENT DATE AND TIME    Adopting a Healthy Lifestyle.  Know what a healthy weight is for you (roughly BMI <25) and aim to maintain this   Aim for 7+ servings of fruits and vegetables daily   65-80+ fluid ounces of water or unsweet tea for healthy kidneys   Limit to max 1 drink of alcohol per day; avoid smoking/tobacco   Limit animal fats in diet for cholesterol and heart health - choose grass fed whenever available   Avoid highly processed foods, and foods high in saturated/trans fats   Aim for  low stress - take time to unwind and care for your mental health   Aim for 150 min of moderate intensity exercise weekly for heart health, and weights twice weekly for bone health   Aim for 7-9 hours of sleep daily   When it comes to diets, agreement about the perfect plan isnt easy to find, even among the experts. Experts at the [place] developed an idea known as the Healthy Eating Plate. Just imagine a plate divided into logical, healthy portions.   The emphasis is on diet quality:   Load up on vegetables and fruits - one-half of your plate: Aim for color and variety, and remember that potatoes dont count.   Go for whole grains - one-quarter of your plate: Whole  wheat, barley, wheat berries, quinoa, oats, brown rice, and foods made with them. If you want pasta, go with whole wheat pasta.   Protein power - one-quarter of your plate: Fish, chicken, beans, and nuts are all healthy, versatile protein sources. Limit red meat.   The diet, however, does go beyond the plate, offering a few other suggestions.   Use healthy plant oils, such as olive, canola, soy, corn, sunflower and peanut. Check the labels, and avoid partially hydrogenated oil, which have unhealthy trans fats.   If youre thirsty, drink water. Coffee and tea are good in moderation, but skip sugary drinks and limit milk and dairy products to one or two daily servings.   The type of carbohydrate in the diet is Zamora important than the amount. Some sources of carbohydrates, such as vegetables, fruits, whole grains, and beans-are healthier than others.   Finally, stay active  Signed, Berniece Salines, DO  04/14/2019 8:36 PM    [facility]

## 2019-04-14 NOTE — H&P (View-Only) (Signed)
Cardiology Office Note:    Date:  04/14/2019   ID:  Tami Zamora, DOB 1965/07/10, MRN [id number]  PCP:  Cher Nakai, MD  Cardiologist:  Shirlee More, MD  Electrophysiologist:  None   Referring MD: Cher Nakai, MD   Follow up visit for chest pain.  History of Present Illness:    Tami Zamora is a 54 y.o. female with CAD status post PCI to the left circumflex marginal branch on May 10, 2018, hypertension, hyperlipidemia, tobacco use, aortic regurgitation recently noted as mild, Hypothyroidism and bipolar affective disorder.   The patient last saw Dr. Bettina Gavia on 08/05/2018 at that time he continued on her dual antiplatelet therapy and wanted to monitor her symptoms for further diagnostic testing.  Patient is here for follow-up visit today.  She tells me that she does have baseline shortness of breath to her COPD however recently has gotten significantly worse.  In addition she is here Friday she was experiencing left-sided chest pressure with shortness of breath which fell exactly like when she had a heart attack in 2020.  She did go to urgent care and she was told that she was fine and she went to the ED at [facility] because she did not think that she was fine at that time she was ported that she did blood work and she was discharged home.  Given the persistence of the pain she called our office yesterday requested to be seen sooner.  In addition she expresses some leg pain and discoloration in her leg when she walks.  Especially in her toes looking cyanotic.  Of note the patient has not followed since June 2020 and she stopped taking her Effient.  Past Medical History:  Diagnosis Date  . Adenocarcinoma of lung, stage 1 ([place]) 11/28/2007   2009 Left upper lobectomy: 1. LUNG, LEFT UPPER LOBE, SEGMENTAL RESECTION: - INVASIVE MODERATELY DIFFERENTIATED ADENOCARCINOMA, 2.0 CM, WITH FOCAL VISCERAL PLEURAL INVOLVEMENT. - NO ANGIOLYMPHATIC INVASION IDENTIFIED. - RESECTION MARGIN IS  NEGATIVE FOR NEOPLASM. - FOUR HILAR LYMPH NODES, NEGATIVE FOR METASTATIC CARCINOMA   Overview:  Overview:  2009 Left upper lobectomy: 1. LUNG, LEFT UPPER  . Bipolar affective disorder ([place])   . Centrilobular emphysema, COPD 01/05/2008   May 2016 simple spirometry FEV1 0.87 L (36% predicted)   Overview:  Overview:  May 2016 simple spirometry FEV1 0.87 L (36% predicted)  Last Assessment & Plan:  COPD: GOLD Grade C Combined recommendations from the [place], [employer] Chest Physicians, Investment banker, corporate, [place] ([place], Ann Intern Med. 2011;155(3):179) recommends   . Chest pain 05/19/2018  . Chronic hypoxemic respiratory failure (HCC) 07/26/2014   2L qHS and with exertoin  Overview:  Overview:  2L qHS and with exertoin  Last Assessment & Plan:  We will start 2 L daily at bedtime and with exertion. Portable oxygen concentrator ordered.  . Chronic obstructive pulmonary disease with acute exacerbation ([place]) 12/08/2014  . COPD (chronic obstructive pulmonary disease) ([place])   . Dizzy 05/19/2018  . Hyperlipidemia   . Hypertension   . Hypothyroidism   . Lung cancer ([place])    dx 2009  . Major depressive disorder without psychotic features 12/08/2014  . Numbness in feet 05/19/2018  . Tobacco abuse 07/26/2014   Failed Chantix "made my bipolar worse"  Overview:  Overview:  Failed Chantix "made my bipolar worse"  Last Assessment & Plan:  Advised at length to quit. She failed Chantix.  She prefers to  try to quit cold [place].    Past Surgical History:  Procedure Laterality Date  . ABDOMINAL HYSTERECTOMY    . INCONTINENCE SURGERY    . LUNG REMOVAL, PARTIAL     2009    Current Medications: Current Meds  Medication Sig  . albuterol (VENTOLIN HFA) 108 (90 Base) MCG/ACT inhaler Inhale into the lungs.  . ALPRAZolam (XANAX) 0.25 MG tablet Take 0.25 mg by mouth every 6 (six) hours as needed for anxiety.  Marland Kitchen aspirin EC 81 MG tablet Take 81 mg by  mouth daily.  Marland Kitchen atorvastatin (LIPITOR) 80 MG tablet TAKE 1 TABLET (80 MG TOTAL) BY MOUTH DAILY AT 6PM.  . isosorbide mononitrate (IMDUR) 30 MG 24 hr tablet **NO REFILLS UNTIL SEEN IN OFFICE**  . methocarbamol (ROBAXIN) 750 MG tablet Take 750 mg by mouth 3 (three) times daily.  . nitroGLYCERIN (NITROSTAT) 0.4 MG SL tablet Place 1 tablet (0.4 mg total) under the tongue every 5 (five) minutes as needed for chest pain.  Marland Kitchen omeprazole (PRILOSEC) 40 MG capsule Take 40 mg by mouth daily.  . pregabalin (LYRICA) 100 MG capsule Take 1 capsule by mouth daily.  . valACYclovir (VALTREX) 500 MG tablet Take 500 mg by mouth daily.  . [DISCONTINUED] isosorbide mononitrate (IMDUR) 30 MG 24 hr tablet **NO REFILLS UNTIL SEEN IN OFFICE**     Allergies:   Morphine, Morphine and related, and Penicillins   Social History   Socioeconomic History  . Marital status: Married    Spouse name: Not on file  . Number of children: Not on file  . Years of education: Not on file  . Highest education level: Not on file  Occupational History  . Not on file  Tobacco Use  . Smoking status: Current Every Day Smoker    Packs/day: 1.00    Years: 36.00    Pack years: 36.00    Types: Cigarettes  . Smokeless tobacco: Never Used  Substance and Sexual Activity  . Alcohol use: Yes    Alcohol/week: 0.0 standard drinks    Comment: occ  . Drug use: Not Currently  . Sexual activity: Not on file  Other Topics Concern  . Not on file  Social History Narrative  . Not on file   Social Determinants of Health   Financial Resource Strain:   . Difficulty of Paying Living Expenses: Not on file  Food Insecurity:   . Worried About Charity fundraiser in the Last Year: Not on file  . Ran Out of Food in the Last Year: Not on file  Transportation Needs:   . Lack of Transportation (Medical): Not on file  . Lack of Transportation (Non-Medical): Not on file  Physical Activity:   . Days of Exercise per Week: Not on file  . Minutes of  Exercise per Session: Not on file  Stress:   . Feeling of Stress : Not on file  Social Connections:   . Frequency of Communication with Friends and Family: Not on file  . Frequency of Social Gatherings with Friends and Family: Not on file  . Attends Religious Services: Not on file  . Active Member of Clubs or Organizations: Not on file  . Attends Archivist Meetings: Not on file  . Marital Status: Not on file     Family History: The patient's family history includes Cancer in her father; Emphysema in her maternal aunt and mother; Heart attack in her mother; Heart disease in her cousin and paternal grandfather; Hypertension in her  paternal uncle.  ROS:   Review of Systems  Constitution: Negative for decreased appetite, fever and weight gain.  HENT: Negative for congestion, ear discharge, hoarse voice and sore throat.   Eyes: Negative for discharge, redness, vision loss in right eye and visual halos.  Cardiovascular: Negative for chest pain, dyspnea on exertion, leg swelling, orthopnea and palpitations.  Respiratory: Negative for cough, hemoptysis, shortness of breath and snoring.   Endocrine: Negative for heat intolerance and polyphagia.  Hematologic/Lymphatic: Negative for bleeding problem. Does not bruise/bleed easily.  Skin: Negative for flushing, nail changes, rash and suspicious lesions.  Musculoskeletal: Negative for arthritis, joint pain, muscle cramps, myalgias, neck pain and stiffness.  Gastrointestinal: Negative for abdominal pain, bowel incontinence, diarrhea and excessive appetite.  Genitourinary: Negative for decreased libido, genital sores and incomplete emptying.  Neurological: Negative for brief paralysis, focal weakness, headaches and loss of balance.  Psychiatric/Behavioral: Negative for altered mental status, depression and suicidal ideas.  Allergic/Immunologic: Negative for HIV exposure and persistent infections.    EKGs/Labs/Other Studies Reviewed:     The following studies were reviewed today:   EKG:  The ekg ordered today demonstrates sinus rhythm, heart rate 90 bpm Q waves in inferior leads suggesting old inferior wall infarction.  Transthoracic echocardiogram September 08, 2018 IMPRESSIONS  1. The left ventricle has normal systolic function with an ejection  fraction of 60-65%. The cavity size was normal. There is mild concentric  left ventricular hypertrophy. Left ventricular diastolic Doppler  parameters are consistent with  pseudonormalization.  2. The right ventricle has normal systolic function. The cavity was  normal. There is no increase in right ventricular wall thickness.  3. No evidence of mitral valve stenosis.  4. The aortic valve is not well visualized and has an indeterminate  number of cusps. Aortic valve regurgitation is mild by color flow Doppler.  5. The aortic root and ascending aorta are normal in size and structure.    Vascular ABI on 09/08/2018 Summary:  Right: Resting right ankle-brachial index is within normal range. No  evidence of significant right lower extremity arterial disease. The right  toe-brachial index is normal.   Left: Resting left ankle-brachial index is within normal range. No  evidence of significant left lower extremity arterial disease. The left  toe-brachial index is normal.    Recent Labs: 08/05/2018: ALT 18; BUN 12; Creatinine, Ser 0.70; NT-Pro BNP 63; Potassium 3.6; Sodium 139  Recent Lipid Panel    Component Value Date/Time   CHOL 140 08/05/2018 1422   TRIG 83 08/05/2018 1422   HDL 55 08/05/2018 1422   CHOLHDL 2.5 08/05/2018 1422   LDLCALC 68 08/05/2018 1422    Physical Exam:    VS:  BP 140/84 (BP Location: Right Arm, Patient Position: Sitting, Cuff Size: Normal)   Pulse 99   Ht 5' (1.524 m)   Wt 137 lb (62.1 kg)   SpO2 93%   BMI 26.76 kg/m     Wt Readings from Last 3 Encounters:  04/14/19 137 lb (62.1 kg)  08/05/18 130 lb (59 kg)  05/19/18 129 lb (58.5 kg)      GEN: Well nourished, well developed in no acute distress HEENT: Normal NECK: No JVD; No carotid bruits LYMPHATICS: No lymphadenopathy CARDIAC: S1S2 noted,RRR, no murmurs, rubs, gallops RESPIRATORY:  Clear to auscultation without rales, wheezing or rhonchi  ABDOMEN: Soft, non-tender, non-distended, +bowel sounds, no guarding. EXTREMITIES: No edema, cyanotic/blue toes  no clubbing MUSCULOSKELETAL:  No deformity  SKIN: Warm and dry NEUROLOGIC:  Alert  and oriented x 3, non-focal PSYCHIATRIC:  Normal affect, good insight  ASSESSMENT:    1. Chest pain, unspecified type   2. Coronary artery disease of native artery of native heart with stable angina pectoris ([place])   3. Essential hypertension   4. Tobacco use   5. Pain in both lower extremities    PLAN:    Her symptoms are concerning and getting worse at this time is appropriate to proceed a left heart catheterization to understand the progression of her coronary artery disease.  In this high risk patient even a negative stress test will not be able to rule out any progression.  I have discussed with the patient in great detail and she is willing to proceed.  In the meantime I am going to restart her Effient therefore she will be taking aspirin 81 mg current and Effient 10 mg daily.  She does take lovastatin 80 mg daily she will continue this. She has Imdur 30 mg daily as well.  The patient understands that risks include but are not limited to stroke (1 in 1000), death (1 in 5), kidney failure [usually temporary] (1 in 500), bleeding (1 in 200), allergic reaction [possibly serious] (1 in 200), and agrees to proceed.   For her leg pain I was able to review her resting ABIs which was normal.  But I think proceeding with exercise ABI with be highly appropriate in this patient.  She is agreeable to proceed with this testing.  Blood work will be performed today which would include BMP, mag as well as troponin.  The patient was counseled on  tobacco cessation today for 5 minutes.  Counseling included reviewing the risks of smoking tobacco products, how it impacts the patient's current medical diagnoses and different strategies for quitting.  Pharmacotherapy to aid in tobacco cessation was not prescribed today. The patient coordinate with  primary care provider.  The patient was also advised to call   1-800-QUIT-NOW 601-351-2597) for additional help with quitting smoking.  The patient is in agreement with the above plan. The patient left the office in stable condition.  The patient will follow up in 1 month or sooner if needed   Medication Adjustments/Labs and Tests Ordered: Current medicines are reviewed at length with the patient today.  Concerns regarding medicines are outlined above.  Orders Placed This Encounter  Procedures  . Basic Metabolic Panel (BMET)  . Magnesium  . CBC  . Troponin T  . EKG 12-Lead   Meds ordered this encounter  Medications  . isosorbide mononitrate (IMDUR) 30 MG 24 hr tablet    Sig: **NO REFILLS UNTIL SEEN IN OFFICE**    Dispense:  90 tablet    Refill:  1  . diltiazem (CARDIZEM CD) 240 MG 24 hr capsule    Sig: Take 1 capsule (240 mg total) by mouth daily.    Dispense:  90 capsule    Refill:  1  . prasugrel (EFFIENT) 10 MG TABS tablet    Sig: Take 1 tablet (10 mg total) by mouth daily.    Dispense:  90 tablet    Refill:  1    Patient Instructions  Medication Instructions:  Your physician has recommended you make the following change in your medication:  RESTART: Cardizem CD(diltiazem) 240 mg TAke 1 tab daily RESTART: Imdur (isosorbide) 30 mg TAke 1 tabe daily RESTART: Effient(prasugrel) 10 mg Take 1 tab daily   *If you need a refill on your cardiac medications before your next appointment, please  call your pharmacy*  Lab Work: Your physician recommends that you return for lab work in: TODAY BMP,CBC,Magnesium,Troponin T  If you have labs (blood work) drawn today and your tests are  completely normal, you will receive your results only by: Marland Kitchen MyChart Message (if you have MyChart) OR . A paper copy in the mail If you have any lab test that is abnormal or we need to change your treatment, we will call you to review the results.  Testing/Procedures:    [place] [address] Dept: [phone] Loc: [phone]  Tami Zamora  04/14/2019  You are scheduled for a Cardiac Catheterization on Thursday, February 18 with Dr. [name].  1. Please arrive at the [facility] (Main Entrance A) at [facility]: [address] [place], [place] [address] at 5:30 AM (This time is two hours before your procedure to ensure your preparation). Free valet parking service is available.   Special note: Every effort is made to have your procedure done on time. Please understand that emergencies sometimes delay scheduled procedures.  2. Diet: Do not eat solid foods after midnight.  The patient may have clear liquids until 5am upon the day of the procedure.  3. Labs: You will need to have a COVID screen 3 days prior to procedure.Your appt. Is on Mon 04/17/19 at 12:25 PM at [place]   4. Medication instructions in preparation for your procedure:   Contrast Allergy: No  On the morning of your procedure, take your Aspirin and any morning medicines NOT listed above.  You may use sips of water.  5. Plan for one night stay--bring personal belongings. 6. Bring a current list of your medications and current insurance cards. 7. You MUST have a responsible person to drive you home. 8. Someone MUST be with you the first 24 hours after you arrive home or your discharge will be delayed. 9. Please wear clothes that are easy to get on and off and wear slip-on shoes.  Thank you for allowing [place] to care for you!   -- [place] [facility]  services   Follow-Up: At [facility], you and your health needs are our priority.  As part of our continuing mission to provide you with exceptional heart care, we have created designated Provider Care Teams.  These Care Teams include your primary Cardiologist (physician) and Advanced Practice Providers (APPs -  Physician Assistants and Nurse Practitioners) who all work together to provide you with the care you need, when you need it.  Your next appointment:   1 month(s)  The format for your next appointment:   In Person  Provider:   Berniece Salines, DO or Shirlee More MD  Other Instructions  YOU WILL BE CALLED ABOUT AN EXERCISE ABI TO EVALUATE LOWER LEGS/TOES. SOMEONE WILL CALL YOU WITH AN APPOINTMENT DATE AND TIME    Adopting a Healthy Lifestyle.  Know what a healthy weight is for you (roughly BMI <25) and aim to maintain this   Aim for 7+ servings of fruits and vegetables daily   65-80+ fluid ounces of water or unsweet tea for healthy kidneys   Limit to max 1 drink of alcohol per day; avoid smoking/tobacco   Limit animal fats in diet for cholesterol and heart health - choose grass fed whenever available   Avoid highly processed foods, and foods high in saturated/trans fats   Aim for  low stress - take time to unwind and care for your mental health   Aim for 150 min of moderate intensity exercise weekly for heart health, and weights twice weekly for bone health   Aim for 7-9 hours of sleep daily   When it comes to diets, agreement about the perfect plan isnt easy to find, even among the experts. Experts at the [employer] developed an idea known as the Healthy Eating Plate. Just imagine a plate divided into logical, healthy portions.   The emphasis is on diet quality:   Load up on vegetables and fruits - one-half of your plate: Aim for color and variety, and remember that potatoes dont count.   Go for whole grains - one-quarter of your plate: Whole  wheat, barley, wheat berries, quinoa, oats, brown rice, and foods made with them. If you want pasta, go with whole wheat pasta.   Protein power - one-quarter of your plate: Fish, chicken, beans, and nuts are all healthy, versatile protein sources. Limit red meat.   The diet, however, does go beyond the plate, offering a few other suggestions.   Use healthy plant oils, such as olive, canola, soy, corn, sunflower and peanut. Check the labels, and avoid partially hydrogenated oil, which have unhealthy trans fats.   If youre thirsty, drink water. Coffee and tea are good in moderation, but skip sugary drinks and limit milk and dairy products to one or two daily servings.   The type of carbohydrate in the diet is more important than the amount. Some sources of carbohydrates, such as vegetables, fruits, whole grains, and beans-are healthier than others.   Finally, stay active  Signed, Berniece Salines, DO  04/14/2019 8:36 PM    [facility]

## 2019-04-14 NOTE — Patient Instructions (Addendum)
Medication Instructions:  Your physician has recommended you make the following change in your medication:  RESTART: Cardizem CD(diltiazem) 240 mg TAke 1 tab daily RESTART: Imdur (isosorbide) 30 mg TAke 1 tabe daily RESTART: Effient(prasugrel) 10 mg Take 1 tab daily   *If you need a refill on your cardiac medications before your next appointment, please call your pharmacy*  Lab Work: Your physician recommends that you return for lab work in: TODAY BMP,CBC,Magnesium,Troponin T  If you have labs (blood work) drawn today and your tests are completely normal, you will receive your results only by: Marland Kitchen MyChart Message (if you have MyChart) OR . A paper copy in the mail If you have any lab test that is abnormal or we need to change your treatment, we will call you to review the results.  Testing/Procedures:    [place] [address] Dept: [phone] Loc: [phone]  Tami Zamora  04/14/2019  You are scheduled for a Cardiac Catheterization on Thursday, February 18 with Dr. [name].  1. Please arrive at the [facility] (Main Entrance A) at [facility]: [address] [place], [place] [address] at 5:30 AM (This time is two hours before your procedure to ensure your preparation). Free valet parking service is available.   Special note: Every effort is made to have your procedure done on time. Please understand that emergencies sometimes delay scheduled procedures.  2. Diet: Do not eat solid foods after midnight.  The patient may have clear liquids until 5am upon the day of the procedure.  3. Labs: You will need to have a COVID screen 3 days prior to procedure.Your appt. Is on Mon 04/17/19 at 12:25 PM at [place]   4. Medication instructions in preparation for your procedure:   Contrast Allergy: No  On the morning of your procedure, take  your Aspirin and any morning medicines NOT listed above.  You may use sips of water.  5. Plan for one night stay--bring personal belongings. 6. Bring a current list of your medications and current insurance cards. 7. You MUST have a responsible person to drive you home. 8. Someone MUST be with you the first 24 hours after you arrive home or your discharge will be delayed. 9. Please wear clothes that are easy to get on and off and wear slip-on shoes.  Thank you for allowing [place] to care for you!   -- [place] [facility]   Follow-Up: At [facility], you and your health needs are our priority.  As part of our continuing mission to provide you with exceptional heart care, we have created designated Provider Care Teams.  These Care Teams include your primary Cardiologist (physician) and Advanced Practice Providers (APPs -  Physician Assistants and Nurse Practitioners) who all work together to provide you with the care you need, when you need it.  Your next appointment:   1 month(s)  The format for your next appointment:   In Person  Provider:   Berniece Salines, DO or Shirlee More MD  Other Instructions  YOU WILL BE CALLED ABOUT AN EXERCISE ABI TO EVALUATE LOWER LEGS/TOES. SOMEONE WILL CALL YOU WITH AN APPOINTMENT DATE AND TIME

## 2019-04-14 NOTE — Addendum Note (Signed)
Addended by: Berniece Salines on: 04/14/2019 10:50 PM   Modules accepted: Orders, SmartSet

## 2019-04-15 LAB — BASIC METABOLIC PANEL
BUN/Creatinine Ratio: 31 — ABNORMAL HIGH (ref 9–23)
BUN: 19 mg/dL (ref 6–24)
CO2: 27 mmol/L (ref 20–29)
Calcium: 9.8 mg/dL (ref 8.7–10.2)
Chloride: 100 mmol/L (ref 96–106)
Creatinine, Ser: 0.61 mg/dL (ref 0.57–1.00)
GFR calc Af Amer: 120 mL/min/{1.73_m2} (ref >59)
GFR calc non Af Amer: 104 mL/min/{1.73_m2} (ref >59)
Glucose: 96 mg/dL (ref 65–99)
Potassium: 4.7 mmol/L (ref 3.5–5.2)
Sodium: 141 mmol/L (ref 134–144)

## 2019-04-15 LAB — CBC
Hematocrit: 44.3 % (ref 34.0–46.6)
Hemoglobin: 15.5 g/dL (ref 11.1–15.9)
MCH: 32.2 pg (ref 26.6–33.0)
MCHC: 35 g/dL (ref 31.5–35.7)
MCV: 92 fL (ref 79–97)
Platelets: 229 10*3/uL (ref 150–450)
RBC: 4.81 x10E6/uL (ref 3.77–5.28)
RDW: 13.7 % (ref 11.7–15.4)
WBC: 9.9 10*3/uL (ref 3.4–10.8)

## 2019-04-15 LAB — MAGNESIUM: Magnesium: 2.2 mg/dL (ref 1.6–2.3)

## 2019-04-15 LAB — TROPONIN T: Troponin T (Highly Sensitive): <6 ng/L (ref 0–14)

## 2019-04-16 ENCOUNTER — Encounter (HOSPITAL_COMMUNITY): Payer: Self-pay | Admitting: Cardiology

## 2019-04-17 ENCOUNTER — Other Ambulatory Visit: Payer: Self-pay | Admitting: *Deleted

## 2019-04-17 ENCOUNTER — Other Ambulatory Visit (HOSPITAL_COMMUNITY)
Admission: RE | Admit: 2019-04-17 | Discharge: 2019-04-17 | Disposition: A | Discharge status: Home / Self Care | Payer: Medicare Other | Source: Ambulatory Visit | Attending: Cardiology | Admitting: Cardiology

## 2019-04-17 DIAGNOSIS — Z20822 Contact with and (suspected) exposure to covid-19: Secondary | ICD-10-CM | POA: Diagnosis not present

## 2019-04-17 DIAGNOSIS — Z01812 Encounter for preprocedural laboratory examination: Secondary | ICD-10-CM | POA: Insufficient documentation

## 2019-04-17 LAB — SARS CORONAVIRUS 2 (TAT 6-24 HRS): SARS Coronavirus 2: NEGATIVE

## 2019-04-17 NOTE — Addendum Note (Signed)
Addended by: Particia Nearing B on: 04/17/2019 08:43 AM   Modules accepted: Orders

## 2019-04-17 NOTE — Addendum Note (Signed)
Addended by: Particia Nearing B on: 04/17/2019 10:35 AM   Modules accepted: Orders

## 2019-04-18 ENCOUNTER — Telehealth: Payer: Self-pay | Admitting: *Deleted

## 2019-04-18 NOTE — Telephone Encounter (Signed)
Pt contacted pre-catheterization scheduled at [facility] for: Thursday April 20, 2019 7:30 AM Verified arrival time and place: [facility]) at: 5:30 AM   No solid food after midnight prior to cath, clear liquids until 5 AM day of procedure. Contrast allergy: no   AM meds can be  taken pre-cath with sip of water including: ASA 81 mg Effient 10 mg    Confirmed patient has responsible adult to drive home post procedure and observe 24 hours after arriving home: yes  Currently, due to Covid-19 pandemic, only one person will be allowed with patient. Must be the same person for patient's entire stay and will be required to wear a mask. They will be asked to wait in the waiting room for the duration of the patient's stay.  Patients are required to wear a mask when they enter the hospital.     COVID-19 Pre-Screening Questions:  . In the past 7 to 10 days have you had a cough,  shortness of breath, headache, congestion, fever (100 or greater) body aches, chills, sore throat, or sudden loss of taste or sense of smell? Shortness of breath, not new-COPD . Have you been around anyone with known Covid 19 in the past 7 to 10 days? no . Have you been around anyone who is awaiting Covid 19 test results in the past 7 to 10 days? no . Have you been around anyone who has been exposed to Covid 19, or has mentioned symptoms of Covid 19 within the past 7 to 10 days? no  I reviewed procedure/mask/visitor instructions, COVID-19 screening questions with patient, she verbalized understanding, thanked me for call.

## 2019-04-19 ENCOUNTER — Telehealth: Payer: Self-pay | Admitting: Cardiology

## 2019-04-19 ENCOUNTER — Encounter (HOSPITAL_COMMUNITY): Payer: Self-pay | Admitting: Cardiology

## 2019-04-19 NOTE — Telephone Encounter (Signed)
Psychologist, sport and exercise with Product/process development scientist ([place], [place]) states she is not certain as to whether or not isosorbide mononitrate (IMDUR) 30 MG 24 hr tablet medication needs to be refilled due to notes reading, "**NO REFILLS UNTIL SEEN IN OFFICE**." I have made her aware that the patient was seen on 04/14/19. Please call to confirm at [phone] [facility] 0).

## 2019-04-20 ENCOUNTER — Encounter (HOSPITAL_COMMUNITY)
Admission: RE | Disposition: A | Discharge status: Home / Self Care | Payer: Self-pay | Source: Home / Self Care | Attending: Cardiology

## 2019-04-20 ENCOUNTER — Other Ambulatory Visit: Payer: Self-pay

## 2019-04-20 ENCOUNTER — Ambulatory Visit (HOSPITAL_COMMUNITY)
Admission: RE | Admit: 2019-04-20 | Discharge: 2019-04-20 | Disposition: A | Discharge status: Home / Self Care | Payer: Medicare Other | Attending: Cardiology | Admitting: Cardiology

## 2019-04-20 DIAGNOSIS — F319 Bipolar disorder, unspecified: Secondary | ICD-10-CM | POA: Diagnosis not present

## 2019-04-20 DIAGNOSIS — J449 Chronic obstructive pulmonary disease, unspecified: Secondary | ICD-10-CM | POA: Diagnosis not present

## 2019-04-20 DIAGNOSIS — F17210 Nicotine dependence, cigarettes, uncomplicated: Secondary | ICD-10-CM | POA: Diagnosis not present

## 2019-04-20 DIAGNOSIS — E785 Hyperlipidemia, unspecified: Secondary | ICD-10-CM | POA: Insufficient documentation

## 2019-04-20 DIAGNOSIS — Z7982 Long term (current) use of aspirin: Secondary | ICD-10-CM | POA: Diagnosis not present

## 2019-04-20 DIAGNOSIS — E039 Hypothyroidism, unspecified: Secondary | ICD-10-CM | POA: Diagnosis not present

## 2019-04-20 DIAGNOSIS — I25118 Atherosclerotic heart disease of native coronary artery with other forms of angina pectoris: Secondary | ICD-10-CM | POA: Insufficient documentation

## 2019-04-20 DIAGNOSIS — Z79899 Other long term (current) drug therapy: Secondary | ICD-10-CM | POA: Insufficient documentation

## 2019-04-20 DIAGNOSIS — Z85118 Personal history of other malignant neoplasm of bronchus and lung: Secondary | ICD-10-CM | POA: Insufficient documentation

## 2019-04-20 DIAGNOSIS — Z955 Presence of coronary angioplasty implant and graft: Secondary | ICD-10-CM | POA: Insufficient documentation

## 2019-04-20 DIAGNOSIS — I200 Unstable angina: Secondary | ICD-10-CM

## 2019-04-20 DIAGNOSIS — M79604 Pain in right leg: Secondary | ICD-10-CM | POA: Insufficient documentation

## 2019-04-20 DIAGNOSIS — M79605 Pain in left leg: Secondary | ICD-10-CM | POA: Diagnosis not present

## 2019-04-20 DIAGNOSIS — R079 Chest pain, unspecified: Secondary | ICD-10-CM

## 2019-04-20 DIAGNOSIS — I10 Essential (primary) hypertension: Secondary | ICD-10-CM | POA: Insufficient documentation

## 2019-04-20 DIAGNOSIS — J9611 Chronic respiratory failure with hypoxia: Secondary | ICD-10-CM | POA: Diagnosis not present

## 2019-04-20 DIAGNOSIS — I351 Nonrheumatic aortic (valve) insufficiency: Secondary | ICD-10-CM | POA: Diagnosis not present

## 2019-04-20 DIAGNOSIS — I25110 Atherosclerotic heart disease of native coronary artery with unstable angina pectoris: Secondary | ICD-10-CM | POA: Diagnosis not present

## 2019-04-20 HISTORY — PX: LEFT HEART CATH AND CORONARY ANGIOGRAPHY: CATH118249

## 2019-04-20 HISTORY — DX: Unstable angina: I20.0

## 2019-04-20 SURGERY — LEFT HEART CATH AND CORONARY ANGIOGRAPHY
Anesthesia: LOCAL

## 2019-04-20 MED ORDER — HEPARIN SODIUM (PORCINE) 1000 UNIT/ML IJ SOLN
INTRAMUSCULAR | Status: DC | PRN
  Administered 2019-04-20: 3000 [IU] via INTRAVENOUS

## 2019-04-20 MED ORDER — SODIUM CHLORIDE 0.9 % WEIGHT BASED INFUSION
3.0000 mL/kg/h | INTRAVENOUS | Status: AC
  Administered 2019-04-20: 3 mL/kg/h via INTRAVENOUS

## 2019-04-20 MED ORDER — MIDAZOLAM HCL 2 MG/2ML IJ SOLN
INTRAMUSCULAR | Status: DC | PRN
  Administered 2019-04-20: 1 mg via INTRAVENOUS

## 2019-04-20 MED ORDER — SODIUM CHLORIDE 0.9% FLUSH: 3.0000 mL | INTRAVENOUS | Status: DC | PRN

## 2019-04-20 MED ORDER — FENTANYL CITRATE (PF) 100 MCG/2ML IJ SOLN
INTRAMUSCULAR | Status: DC | PRN
  Administered 2019-04-20: 25 ug via INTRAVENOUS

## 2019-04-20 MED ORDER — LIDOCAINE HCL (PF) 1 % IJ SOLN
INTRAMUSCULAR | Status: AC
  Filled 2019-04-20: qty 30

## 2019-04-20 MED ORDER — MIDAZOLAM HCL 2 MG/2ML IJ SOLN
INTRAMUSCULAR | Status: AC
  Filled 2019-04-20: qty 2

## 2019-04-20 MED ORDER — SODIUM CHLORIDE 0.9 % IV SOLN: 250.0000 mL | INTRAVENOUS | Status: DC | PRN

## 2019-04-20 MED ORDER — HEPARIN (PORCINE) IN NACL 1000-0.9 UT/500ML-% IV SOLN
INTRAVENOUS | Status: DC | PRN
  Administered 2019-04-20 (×2): 500 mL

## 2019-04-20 MED ORDER — SODIUM CHLORIDE 0.9 % WEIGHT BASED INFUSION: 1.0000 mL/kg/h | INTRAVENOUS | Status: DC

## 2019-04-20 MED ORDER — ACETAMINOPHEN 325 MG PO TABS: 650.0000 mg | ORAL_TABLET | ORAL | Status: DC | PRN

## 2019-04-20 MED ORDER — HEPARIN SODIUM (PORCINE) 1000 UNIT/ML IJ SOLN
INTRAMUSCULAR | Status: AC
  Filled 2019-04-20: qty 1

## 2019-04-20 MED ORDER — LABETALOL HCL 5 MG/ML IV SOLN: 10.0000 mg | INTRAVENOUS | Status: DC | PRN

## 2019-04-20 MED ORDER — ONDANSETRON HCL 4 MG/2ML IJ SOLN: 4.0000 mg | Freq: Four times a day (QID) | INTRAMUSCULAR | Status: DC | PRN

## 2019-04-20 MED ORDER — IOHEXOL 350 MG/ML SOLN
INTRAVENOUS | Status: DC | PRN
  Administered 2019-04-20: 60 mL

## 2019-04-20 MED ORDER — VERAPAMIL HCL 2.5 MG/ML IV SOLN
INTRAVENOUS | Status: AC
  Filled 2019-04-20: qty 2

## 2019-04-20 MED ORDER — LIDOCAINE HCL (PF) 1 % IJ SOLN
INTRAMUSCULAR | Status: DC | PRN
  Administered 2019-04-20: 2 mL

## 2019-04-20 MED ORDER — VERAPAMIL HCL 2.5 MG/ML IV SOLN
INTRAVENOUS | Status: DC | PRN
  Administered 2019-04-20: 08:00:00 10 mL via INTRA_ARTERIAL

## 2019-04-20 MED ORDER — SODIUM CHLORIDE 0.9 % IV SOLN: INTRAVENOUS | Status: DC

## 2019-04-20 MED ORDER — SODIUM CHLORIDE 0.9% FLUSH: 3.0000 mL | Freq: Two times a day (BID) | INTRAVENOUS | Status: DC

## 2019-04-20 MED ORDER — HYDRALAZINE HCL 20 MG/ML IJ SOLN: 10.0000 mg | INTRAMUSCULAR | Status: DC | PRN

## 2019-04-20 MED ORDER — HEPARIN (PORCINE) IN NACL 1000-0.9 UT/500ML-% IV SOLN
INTRAVENOUS | Status: AC
  Filled 2019-04-20: qty 1000

## 2019-04-20 MED ORDER — FENTANYL CITRATE (PF) 100 MCG/2ML IJ SOLN
INTRAMUSCULAR | Status: AC
  Filled 2019-04-20: qty 2

## 2019-04-20 SURGICAL SUPPLY — 10 items
CATH INFINITI 5FR ANG PIGTAIL (CATHETERS) ×2 IMPLANT
CATH OPTITORQUE TIG 4.0 5F (CATHETERS) ×1 IMPLANT
DEVICE RAD COMP TR BAND LRG (VASCULAR PRODUCTS) ×2 IMPLANT
GLIDESHEATH SLEND SS 6F .021 (SHEATH) ×1 IMPLANT
GUIDEWIRE INQWIRE 1.5J.035X260 (WIRE) ×1 IMPLANT
INQWIRE 1.5J .035X260CM (WIRE) ×2
KIT HEART LEFT (KITS) ×2 IMPLANT
PACK CARDIAC CATHETERIZATION (CUSTOM PROCEDURE TRAY) ×2 IMPLANT
TRANSDUCER W/STOPCOCK (MISCELLANEOUS) ×2 IMPLANT
TUBING CIL FLEX 10 FLL-RA (TUBING) ×2 IMPLANT

## 2019-04-20 NOTE — Telephone Encounter (Signed)
Renew the prescription please

## 2019-04-20 NOTE — Discharge Instructions (Signed)
Radial Site Care  This sheet gives you information about how to care for yourself after your procedure. Your health care provider may also give you more specific instructions. If you have problems or questions, contact your health care provider. What can I expect after the procedure? After the procedure, it is common to have:  Bruising and tenderness at the catheter insertion area. Follow these instructions at home: Medicines  Take over-the-counter and prescription medicines only as told by your health care provider. Insertion site care  Follow instructions from your health care provider about how to take care of your insertion site. Make sure you: ? Wash your hands with soap and water before you change your bandage (dressing). If soap and water are not available, use hand sanitizer. ? Change your dressing as told by your health care provider. ? Leave stitches (sutures), skin glue, or adhesive strips in place. These skin closures may need to stay in place for 2 weeks or longer. If adhesive strip edges start to loosen and curl up, you may trim the loose edges. Do not remove adhesive strips completely unless your health care provider tells you to do that.  Check your insertion site every day for signs of infection. Check for: ? Redness, swelling, or pain. ? Fluid or blood. ? Pus or a bad smell. ? Warmth.  Do not take baths, swim, or use a hot tub until your health care provider approves.  You may shower 24-48 hours after the procedure, or as directed by your health care provider. ? Remove the dressing and gently wash the site with plain soap and water. ? Pat the area dry with a clean towel. ? Do not rub the site. That could cause bleeding.  Do not apply powder or lotion to the site. Activity   For 24 hours after the procedure, or as directed by your health care provider: ? Do not flex or bend the affected arm. ? Do not push or pull heavy objects with the affected arm. ? Do not  drive yourself home from the hospital or clinic. You may drive 24 hours after the procedure unless your health care provider tells you not to. ? Do not operate machinery or power tools.  Do not lift anything that is heavier than 10 lb (4.5 kg), or the limit that you are told, until your health care provider says that it is safe.  Ask your health care provider when it is okay to: ? Return to work or school. ? Resume usual physical activities or sports. ? Resume sexual activity. General instructions  If the catheter site starts to bleed, raise your arm and put firm pressure on the site. If the bleeding does not stop, get help right away. This is a medical emergency.  If you went home on the same day as your procedure, a responsible adult should be with you for the first 24 hours after you arrive home.  Keep all follow-up visits as told by your health care provider. This is important. Contact a health care provider if:  You have a fever.  You have redness, swelling, or yellow drainage around your insertion site. Get help right away if:  You have unusual pain at the radial site.  The catheter insertion area swells very fast.  The insertion area is bleeding, and the bleeding does not stop when you hold steady pressure on the area.  Your arm or hand becomes pale, cool, tingly, or numb. These symptoms may represent a serious problem   that is an emergency. Do not wait to see if the symptoms will go away. Get medical help right away. Call your local emergency services (911 in the [place] Do not drive yourself to the hospital. Summary  After the procedure, it is common to have bruising and tenderness at the site.  Follow instructions from your health care provider about how to take care of your radial site wound. Check the wound every day for signs of infection.  Do not lift anything that is heavier than 10 lb (4.5 kg), or the limit that you are told, until your health care provider says  that it is safe. This information is not intended to replace advice given to you by your health care provider. Make sure you discuss any questions you have with your health care provider. Document Revised: 03/24/2017 Document Reviewed: 03/24/2017 Elsevier Patient Education  2020 Elsevier Inc.  

## 2019-04-20 NOTE — Interval H&P Note (Signed)
History and Physical Interval Note:  04/20/2019 7:31 AM  Tami Zamora  has presented today for surgery, with the diagnosis of CAD.    Past Cardiac Procedure History:  Procedure Laterality Date   CORONARY STENT INTERVENTION  05/10/2018   [place]: 75 % OM1 - 0%: Promus Elite DES 2.5 x 16 (2.6 mm)   LEFT HEART CATH AND CORONARY ANGIOGRAPHY  05/10/2018   [place]: R dominant. Mostly normal except 75% OM1 -- DES PCI   TRANSTHORACIC ECHOCARDIOGRAM  05/11/2018   [facility] [place]: post MI --> EF 55-60%, no RWMA. Mild AI. Otherwise normal.      The various methods of treatment have been discussed with the patient and family. After consideration of risks, benefits and other options for treatment, the patient has consented to  Procedure(s): LEFT HEART CATH AND CORONARY ANGIOGRAPHY (N/A)  PERCUTANEOUS CORONARY INTERVENTION  as a surgical intervention.  The patient's history has been reviewed, patient examined, no change in status, stable for surgery.  I have reviewed the patient's chart and labs.  Questions were answered to the patient's satisfaction.    Cath Lab Visit (complete for each Cath Lab visit)  Clinical Evaluation Leading to the Procedure:   ACS: No.  Non-ACS:    Anginal Classification: CCS III  Anti-ischemic medical therapy: Minimal Therapy (1 class of medications)  Non-Invasive Test Results: No non-invasive testing performed  Prior CABG: No previous CABG   [name]

## 2019-04-24 DIAGNOSIS — J439 Emphysema, unspecified: Secondary | ICD-10-CM | POA: Diagnosis not present

## 2019-04-24 DIAGNOSIS — J309 Allergic rhinitis, unspecified: Secondary | ICD-10-CM | POA: Diagnosis not present

## 2019-04-24 DIAGNOSIS — M1990 Unspecified osteoarthritis, unspecified site: Secondary | ICD-10-CM | POA: Diagnosis not present

## 2019-04-24 DIAGNOSIS — C3412 Malignant neoplasm of upper lobe, left bronchus or lung: Secondary | ICD-10-CM | POA: Diagnosis not present

## 2019-04-30 DIAGNOSIS — J449 Chronic obstructive pulmonary disease, unspecified: Secondary | ICD-10-CM | POA: Diagnosis not present

## 2019-05-10 DIAGNOSIS — J309 Allergic rhinitis, unspecified: Secondary | ICD-10-CM | POA: Diagnosis not present

## 2019-05-10 DIAGNOSIS — C3412 Malignant neoplasm of upper lobe, left bronchus or lung: Secondary | ICD-10-CM | POA: Diagnosis not present

## 2019-05-10 DIAGNOSIS — J439 Emphysema, unspecified: Secondary | ICD-10-CM | POA: Diagnosis not present

## 2019-05-11 DIAGNOSIS — J439 Emphysema, unspecified: Secondary | ICD-10-CM | POA: Diagnosis not present

## 2019-05-11 DIAGNOSIS — C3412 Malignant neoplasm of upper lobe, left bronchus or lung: Secondary | ICD-10-CM | POA: Diagnosis not present

## 2019-05-11 DIAGNOSIS — J309 Allergic rhinitis, unspecified: Secondary | ICD-10-CM | POA: Diagnosis not present

## 2019-05-11 DIAGNOSIS — J208 Acute bronchitis due to other specified organisms: Secondary | ICD-10-CM | POA: Diagnosis not present

## 2019-05-11 DIAGNOSIS — M1990 Unspecified osteoarthritis, unspecified site: Secondary | ICD-10-CM | POA: Diagnosis not present

## 2019-05-12 DIAGNOSIS — J208 Acute bronchitis due to other specified organisms: Secondary | ICD-10-CM | POA: Diagnosis not present

## 2019-05-12 DIAGNOSIS — J309 Allergic rhinitis, unspecified: Secondary | ICD-10-CM | POA: Diagnosis not present

## 2019-05-12 DIAGNOSIS — C3412 Malignant neoplasm of upper lobe, left bronchus or lung: Secondary | ICD-10-CM | POA: Diagnosis not present

## 2019-05-12 DIAGNOSIS — M1990 Unspecified osteoarthritis, unspecified site: Secondary | ICD-10-CM | POA: Diagnosis not present

## 2019-05-12 DIAGNOSIS — J439 Emphysema, unspecified: Secondary | ICD-10-CM | POA: Diagnosis not present

## 2019-05-19 DIAGNOSIS — J208 Acute bronchitis due to other specified organisms: Secondary | ICD-10-CM | POA: Diagnosis not present

## 2019-05-19 DIAGNOSIS — M546 Pain in thoracic spine: Secondary | ICD-10-CM | POA: Diagnosis not present

## 2019-05-21 NOTE — Progress Notes (Signed)
Cardiology Office Note:    Date:  05/22/2019   ID:  Tami Zamora, DOB 1965-04-01, MRN [id number]  PCP:  Cher Nakai, MD  Cardiologist:  Shirlee More, MD    Referring MD: Cher Nakai, MD    ASSESSMENT:    1. Coronary artery disease of native artery of native heart with stable angina pectoris ([place])   2. Essential hypertension   3. Mixed hyperlipidemia   4. Chronic obstructive pulmonary disease with acute exacerbation (HCC)    PLAN:    In order of problems listed above:  1. Stable CAD more than 1 year remote from PCI no evidence of restenosis on recent heart catheterization discontinue prasugrel maintain on low-dose aspirin 81 mg daily 2. BP at target continue current treatment calcium channel blocker 3. Continue statin if needed to discontinue PCSK9 inhibitor be appropriate 4. Stable managed by her PCP   Next appointment: 1 year   Medication Adjustments/Labs and Tests Ordered: Current medicines are reviewed at length with the patient today.  Concerns regarding medicines are outlined above.  No orders of the defined types were placed in this encounter.  No orders of the defined types were placed in this encounter.   Chief Complaint  Patient presents with  . Follow-up    After left heart catheterization  . Coronary Artery Disease    History of Present Illness:    Tami Zamora is a 54 y.o. female with a hx of COPD and CAD with inferior myocardial infarction and PCI and stent of left circumflex coronary marginal branch 05/10/2018 last seen 08/05/2018.  She also has mild aortic regurgitation 09/08/2018 with normal left ventricular size and systolic function EF 60 to 65%.  Compliance with diet, lifestyle and medications: Yes  Reviewed her coronary angiogram with her.  Her biggest concern is bruising is more than 1 year remote from PCI will discontinue prasugrel and remain on aspirin.  She will alert me she has had rectal bleeding GI evaluation and has iron deficiency  anemia.  I think she is high risk for long-term dual antiplatelet therapy.  Her second concern is diffuse body ache diagnosis of fibromyalgia and is on Lyrica and followed by her PCP.  Not typical statin myalgia however if he needed to we could stop statin use PCSK9 agent.  She has chronic shortness of breath due to COPD not progressive no edema orthopnea chest pain palpitation or syncope  She underwent recent coronary angiography 04/20/2019.  Showed the presence of widely patent stent in the mid left circumflex coronary artery and no other obstructive coronary disease and normal ejection fraction.  I independently reviewed her coronary angiography and agree with the official interpretation.  Coronary Diagrams  Diagnostic Dominance: Right   Past Medical History:  Diagnosis Date  . Acute coronary syndrome with high troponin ([place]) 05/2018   [facility] - Cath - 1 V CAD - DES PCI OM1  . Adenocarcinoma of lung, stage 1 ([place]) 11/28/2007   2009 Left upper lobectomy: 1. LUNG, LEFT UPPER LOBE, SEGMENTAL RESECTION: - INVASIVE MODERATELY DIFFERENTIATED ADENOCARCINOMA, 2.0 CM, WITH FOCAL VISCERAL PLEURAL INVOLVEMENT. - NO ANGIOLYMPHATIC INVASION IDENTIFIED. - RESECTION MARGIN IS NEGATIVE FOR NEOPLASM. - FOUR HILAR LYMPH NODES, NEGATIVE FOR METASTATIC CARCINOMA   Overview:  Overview:  2009 Left upper lobectomy: 1. LUNG, LEFT UPPER  . Aortic regurgitation 07/21/2018   Mild on TTE March 2020  . Bipolar affective disorder ([place])   . CAD S/P DES PCI - OM1 05/2018   75% OM1 - DES  PCI - Promus Elite DES 2.5 x 16 (post-dilated to ~2.6 mm)  . Centrilobular emphysema, COPD 01/05/2008   May 2016 simple spirometry FEV1 0.87 L (36% predicted)   Overview:  Overview:  May 2016 simple spirometry FEV1 0.87 L (36% predicted)  Last Assessment & Plan:  COPD: GOLD Grade C Combined recommendations from the [place], [employer], Investment banker, corporate, [place] ([place], Ann Intern Med. 2011;155(3):179) recommends   . Chest pain 05/19/2018  . Chronic hypoxemic respiratory failure (HCC) 07/26/2014   2L qHS and with exertoin  Overview:  Overview:  2L qHS and with exertoin  Last Assessment & Plan:  We will start 2 L daily at bedtime and with exertion. Portable oxygen concentrator ordered.  . Chronic obstructive pulmonary disease with acute exacerbation ([place]) 12/08/2014  . COPD (chronic obstructive pulmonary disease) ([place])   . Coronary artery disease of native artery of native heart with stable angina pectoris ([place]) 05/19/2018  . Dizzy 05/19/2018  . History of ST elevation myocardial infarction (STEMI) 05/19/2018  . Hyperlipidemia   . Hypertension   . Hypothyroidism   . Major depressive disorder without psychotic features 12/08/2014  . Numbness in feet 05/19/2018  . Pain in both lower extremities 04/14/2019  . Progressive angina ([place]) - ~ Class III 04/20/2019  . Tobacco abuse 07/26/2014   Failed Chantix "made my bipolar worse"  Overview:  Overview:  Failed Chantix "made my bipolar worse"  Last Assessment & Plan:  Advised at length to quit. She failed Chantix.  She prefers to try to quit cold [place].    Past Surgical History:  Procedure Laterality Date  . ABDOMINAL HYSTERECTOMY    . CORONARY STENT INTERVENTION  05/10/2018   [place]: 75 % OM1 - 0%: Promus Elite DES 2.5 x 16 (2.6 mm)  . INCONTINENCE SURGERY    . LEFT HEART CATH AND CORONARY ANGIOGRAPHY  05/10/2018   [place]: R dominant. Mostly normal except 75% OM1 -- DES PCI  . LEFT HEART CATH AND CORONARY ANGIOGRAPHY N/A 04/20/2019   Procedure: LEFT HEART CATH AND CORONARY ANGIOGRAPHY;  Surgeon: Leonie Man, MD;  Location: [facility];  Service: Cardiovascular;  Laterality: N/A;  . LUNG REMOVAL, PARTIAL     2009  . TRANSTHORACIC ECHOCARDIOGRAM  05/11/2018   [place]: post MI --> EF 55-60%, no RWMA. Mild AI. Otherwise normal.     Current Medications: Current Meds  Medication Sig  .  albuterol (VENTOLIN HFA) 108 (90 Base) MCG/ACT inhaler Inhale 2 puffs into the lungs every 4 (four) hours as needed for wheezing or shortness of breath.   . ALPRAZolam (XANAX) 0.25 MG tablet Take 0.25 mg by mouth 2 (two) times daily.   Marland Kitchen atorvastatin (LIPITOR) 80 MG tablet Take 80 mg by mouth daily at 6 PM.   . diltiazem (CARDIZEM CD) 240 MG 24 hr capsule Take 1 capsule (240 mg total) by mouth daily.  Marland Kitchen doxycycline (VIBRA-TABS) 100 MG tablet Take 100 mg by mouth 2 (two) times daily.  . isosorbide mononitrate (IMDUR) 30 MG 24 hr tablet **NO REFILLS UNTIL SEEN IN OFFICE**  . nitroGLYCERIN (NITROSTAT) 0.4 MG SL tablet Place 1 tablet (0.4 mg total) under the tongue every 5 (five) minutes as needed for chest pain.  Marland Kitchen omeprazole (PRILOSEC) 40 MG capsule Take 40 mg by mouth daily.  . OXYGEN Inhale 2 L into the lungs daily as needed (oxygen level below 90).  . prasugrel (EFFIENT) 10 MG  TABS tablet Take 1 tablet (10 mg total) by mouth daily.  . pregabalin (LYRICA) 100 MG capsule Take 100 mg by mouth daily.   . valACYclovir (VALTREX) 500 MG tablet Take 500 mg by mouth daily.     Allergies:   Morphine, Morphine and related, and Penicillins   Social History   Socioeconomic History  . Marital status: Divorced    Spouse name: Not on file  . Number of children: Not on file  . Years of education: Not on file  . Highest education level: Not on file  Occupational History  . Not on file  Tobacco Use  . Smoking status: Current Every Day Smoker    Packs/day: 1.00    Years: 36.00    Pack years: 36.00    Types: Cigarettes  . Smokeless tobacco: Never Used  Substance and Sexual Activity  . Alcohol use: Yes    Alcohol/week: 0.0 standard drinks    Comment: occ  . Drug use: Not Currently  . Sexual activity: Not on file  Other Topics Concern  . Not on file  Social History Narrative  . Not on file   Social Determinants of Health   Financial Resource Strain:   . Difficulty of Paying Living Expenses:    Food Insecurity:   . Worried About Charity fundraiser in the Last Year:   . Arboriculturist in the Last Year:   Transportation Needs:   . Film/video editor (Medical):   Marland Kitchen Lack of Transportation (Non-Medical):   Physical Activity:   . Days of Exercise per Week:   . Minutes of Exercise per Session:   Stress:   . Feeling of Stress :   Social Connections:   . Frequency of Communication with Friends and Family:   . Frequency of Social Gatherings with Friends and Family:   . Attends Religious Services:   . Active Member of Clubs or Organizations:   . Attends Archivist Meetings:   Marland Kitchen Marital Status:      Family History: The patient's family history includes Cancer in her father; Emphysema in her maternal aunt and mother; Heart attack in her mother; Heart disease in her cousin and paternal grandfather; Hypertension in her paternal uncle. ROS:   Please see the history of present illness.    All other systems reviewed and are negative.  EKGs/Labs/Other Studies Reviewed:    The following studies were reviewed today:    Recent Labs: 08/05/2018: ALT 18; NT-Pro BNP 63 04/14/2019: BUN 19; Creatinine, Ser 0.61; Hemoglobin 15.5; Magnesium 2.2; Platelets 229; Potassium 4.7; Sodium 141  Recent Lipid Panel    Component Value Date/Time   CHOL 140 08/05/2018 1422   TRIG 83 08/05/2018 1422   HDL 55 08/05/2018 1422   CHOLHDL 2.5 08/05/2018 1422   LDLCALC 68 08/05/2018 1422    Physical Exam:    VS:  BP 130/84   Pulse 96   Ht 5' (1.524 m)   Wt 128 lb (58.1 kg)   SpO2 95%   BMI 25.00 kg/m     Wt Readings from Last 3 Encounters:  05/22/19 128 lb (58.1 kg)  04/20/19 131 lb (59.4 kg)  04/14/19 137 lb (62.1 kg)     GEN:  Well nourished, well developed in no acute distress HEENT: Normal NECK: No JVD; No carotid bruits LYMPHATICS: No lymphadenopathy CARDIAC: RRR, no murmurs, rubs, gallops RESPIRATORY:  Clear to auscultation without rales, wheezing or rhonchi  ABDOMEN:  Soft, non-tender, non-distended MUSCULOSKELETAL:  No edema; No deformity  SKIN: Warm and dry NEUROLOGIC:  Alert and oriented x 3 PSYCHIATRIC:  Normal affect    Signed, Shirlee More, MD  05/22/2019 9:43 AM    [place]

## 2019-05-22 ENCOUNTER — Other Ambulatory Visit: Payer: Self-pay

## 2019-05-22 ENCOUNTER — Ambulatory Visit: Payer: Medicare Other | Admitting: Cardiology

## 2019-05-22 VITALS — BP 130/84 | HR 96 | Ht 60.0 in | Wt 128.0 lb

## 2019-05-22 DIAGNOSIS — I10 Essential (primary) hypertension: Secondary | ICD-10-CM

## 2019-05-22 DIAGNOSIS — I25118 Atherosclerotic heart disease of native coronary artery with other forms of angina pectoris: Secondary | ICD-10-CM | POA: Diagnosis not present

## 2019-05-22 DIAGNOSIS — R609 Edema, unspecified: Secondary | ICD-10-CM | POA: Diagnosis not present

## 2019-05-22 DIAGNOSIS — M1990 Unspecified osteoarthritis, unspecified site: Secondary | ICD-10-CM | POA: Diagnosis not present

## 2019-05-22 DIAGNOSIS — J441 Chronic obstructive pulmonary disease with (acute) exacerbation: Secondary | ICD-10-CM | POA: Diagnosis not present

## 2019-05-22 DIAGNOSIS — C3412 Malignant neoplasm of upper lobe, left bronchus or lung: Secondary | ICD-10-CM | POA: Diagnosis not present

## 2019-05-22 DIAGNOSIS — E039 Hypothyroidism, unspecified: Secondary | ICD-10-CM | POA: Diagnosis not present

## 2019-05-22 DIAGNOSIS — E785 Hyperlipidemia, unspecified: Secondary | ICD-10-CM | POA: Diagnosis not present

## 2019-05-22 DIAGNOSIS — J309 Allergic rhinitis, unspecified: Secondary | ICD-10-CM | POA: Diagnosis not present

## 2019-05-22 DIAGNOSIS — D509 Iron deficiency anemia, unspecified: Secondary | ICD-10-CM | POA: Diagnosis not present

## 2019-05-22 DIAGNOSIS — E782 Mixed hyperlipidemia: Secondary | ICD-10-CM

## 2019-05-22 NOTE — Patient Instructions (Addendum)
Medication Instructions:  Your physician has recommended you make the following change in your medication:  1. STOP Prasugrel 10 mg.   *If you need a refill on your cardiac medications before your next appointment, please call your pharmacy*   Lab Work: None If you have labs (blood work) drawn today and your tests are completely normal, you will receive your results only by: Marland Kitchen MyChart Message (if you have MyChart) OR . A paper copy in the mail If you have any lab test that is abnormal or we need to change your treatment, we will call you to review the results.   Testing/Procedures: None   Follow-Up: At [facility], you and your health needs are our priority.  As part of our continuing mission to provide you with exceptional heart care, we have created designated Provider Care Teams.  These Care Teams include your primary Cardiologist (physician) and Advanced Practice Providers (APPs -  Physician Assistants and Nurse Practitioners) who all work together to provide you with the care you need, when you need it.  We recommend signing up for the patient portal called "MyChart".  Sign up information is provided on this After Visit Summary.  MyChart is used to connect with patients for Virtual Visits (Telemedicine).  Patients are able to view lab/test results, encounter notes, upcoming appointments, etc.  Non-urgent messages can be sent to your provider as well.   To learn more about what you can do with MyChart, go to [email or web address].    Your next appointment:   1 year(s)  The format for your next appointment:   In Person  Provider:   Shirlee More, MD   Other Instructions

## 2019-05-29 DIAGNOSIS — J449 Chronic obstructive pulmonary disease, unspecified: Secondary | ICD-10-CM | POA: Diagnosis not present

## 2019-06-05 DIAGNOSIS — J449 Chronic obstructive pulmonary disease, unspecified: Secondary | ICD-10-CM | POA: Diagnosis not present

## 2019-06-07 DIAGNOSIS — Z1231 Encounter for screening mammogram for malignant neoplasm of breast: Secondary | ICD-10-CM | POA: Diagnosis not present

## 2019-06-07 DIAGNOSIS — Z0000 Encounter for general adult medical examination without abnormal findings: Secondary | ICD-10-CM | POA: Diagnosis not present

## 2019-06-07 DIAGNOSIS — Z9181 History of falling: Secondary | ICD-10-CM | POA: Diagnosis not present

## 2019-06-07 DIAGNOSIS — E785 Hyperlipidemia, unspecified: Secondary | ICD-10-CM | POA: Diagnosis not present

## 2019-06-13 DIAGNOSIS — J309 Allergic rhinitis, unspecified: Secondary | ICD-10-CM | POA: Diagnosis not present

## 2019-06-13 DIAGNOSIS — C3412 Malignant neoplasm of upper lobe, left bronchus or lung: Secondary | ICD-10-CM | POA: Diagnosis not present

## 2019-06-13 DIAGNOSIS — J449 Chronic obstructive pulmonary disease, unspecified: Secondary | ICD-10-CM | POA: Diagnosis not present

## 2019-06-13 DIAGNOSIS — R609 Edema, unspecified: Secondary | ICD-10-CM | POA: Diagnosis not present

## 2019-06-13 DIAGNOSIS — J301 Allergic rhinitis due to pollen: Secondary | ICD-10-CM | POA: Diagnosis not present

## 2019-06-19 DIAGNOSIS — C3412 Malignant neoplasm of upper lobe, left bronchus or lung: Secondary | ICD-10-CM | POA: Diagnosis not present

## 2019-06-19 DIAGNOSIS — R609 Edema, unspecified: Secondary | ICD-10-CM | POA: Diagnosis not present

## 2019-06-19 DIAGNOSIS — J309 Allergic rhinitis, unspecified: Secondary | ICD-10-CM | POA: Diagnosis not present

## 2019-06-29 DIAGNOSIS — J449 Chronic obstructive pulmonary disease, unspecified: Secondary | ICD-10-CM | POA: Diagnosis not present

## 2019-07-05 DIAGNOSIS — J449 Chronic obstructive pulmonary disease, unspecified: Secondary | ICD-10-CM | POA: Diagnosis not present

## 2019-07-17 DIAGNOSIS — R609 Edema, unspecified: Secondary | ICD-10-CM | POA: Diagnosis not present

## 2019-07-17 DIAGNOSIS — I2510 Atherosclerotic heart disease of native coronary artery without angina pectoris: Secondary | ICD-10-CM | POA: Diagnosis not present

## 2019-07-17 DIAGNOSIS — E039 Hypothyroidism, unspecified: Secondary | ICD-10-CM | POA: Diagnosis not present

## 2019-07-17 DIAGNOSIS — C3412 Malignant neoplasm of upper lobe, left bronchus or lung: Secondary | ICD-10-CM | POA: Diagnosis not present

## 2019-07-29 DIAGNOSIS — J449 Chronic obstructive pulmonary disease, unspecified: Secondary | ICD-10-CM | POA: Diagnosis not present

## 2019-08-05 DIAGNOSIS — J449 Chronic obstructive pulmonary disease, unspecified: Secondary | ICD-10-CM | POA: Diagnosis not present

## 2019-08-14 DIAGNOSIS — D509 Iron deficiency anemia, unspecified: Secondary | ICD-10-CM | POA: Diagnosis not present

## 2019-08-14 DIAGNOSIS — C3412 Malignant neoplasm of upper lobe, left bronchus or lung: Secondary | ICD-10-CM | POA: Diagnosis not present

## 2019-08-14 DIAGNOSIS — I10 Essential (primary) hypertension: Secondary | ICD-10-CM | POA: Diagnosis not present

## 2019-08-14 DIAGNOSIS — E785 Hyperlipidemia, unspecified: Secondary | ICD-10-CM | POA: Diagnosis not present

## 2019-08-14 DIAGNOSIS — R609 Edema, unspecified: Secondary | ICD-10-CM | POA: Diagnosis not present

## 2019-08-14 DIAGNOSIS — I2510 Atherosclerotic heart disease of native coronary artery without angina pectoris: Secondary | ICD-10-CM | POA: Diagnosis not present

## 2019-08-14 DIAGNOSIS — E039 Hypothyroidism, unspecified: Secondary | ICD-10-CM | POA: Diagnosis not present

## 2019-08-21 DIAGNOSIS — E039 Hypothyroidism, unspecified: Secondary | ICD-10-CM | POA: Diagnosis not present

## 2019-08-21 DIAGNOSIS — C3412 Malignant neoplasm of upper lobe, left bronchus or lung: Secondary | ICD-10-CM | POA: Diagnosis not present

## 2019-08-21 DIAGNOSIS — R609 Edema, unspecified: Secondary | ICD-10-CM | POA: Diagnosis not present

## 2019-08-21 DIAGNOSIS — I2510 Atherosclerotic heart disease of native coronary artery without angina pectoris: Secondary | ICD-10-CM | POA: Diagnosis not present

## 2019-08-29 DIAGNOSIS — J449 Chronic obstructive pulmonary disease, unspecified: Secondary | ICD-10-CM | POA: Diagnosis not present

## 2019-09-04 DIAGNOSIS — J449 Chronic obstructive pulmonary disease, unspecified: Secondary | ICD-10-CM | POA: Diagnosis not present

## 2019-09-11 DIAGNOSIS — M25511 Pain in right shoulder: Secondary | ICD-10-CM | POA: Diagnosis not present

## 2019-09-11 DIAGNOSIS — C3412 Malignant neoplasm of upper lobe, left bronchus or lung: Secondary | ICD-10-CM | POA: Diagnosis not present

## 2019-09-11 DIAGNOSIS — E039 Hypothyroidism, unspecified: Secondary | ICD-10-CM | POA: Diagnosis not present

## 2019-09-28 DIAGNOSIS — J449 Chronic obstructive pulmonary disease, unspecified: Secondary | ICD-10-CM | POA: Diagnosis not present

## 2019-10-05 DIAGNOSIS — J449 Chronic obstructive pulmonary disease, unspecified: Secondary | ICD-10-CM | POA: Diagnosis not present

## 2019-10-13 DIAGNOSIS — H6692 Otitis media, unspecified, left ear: Secondary | ICD-10-CM | POA: Diagnosis not present

## 2019-10-13 DIAGNOSIS — C3412 Malignant neoplasm of upper lobe, left bronchus or lung: Secondary | ICD-10-CM | POA: Diagnosis not present

## 2019-10-13 DIAGNOSIS — E039 Hypothyroidism, unspecified: Secondary | ICD-10-CM | POA: Diagnosis not present

## 2019-10-13 DIAGNOSIS — M25512 Pain in left shoulder: Secondary | ICD-10-CM | POA: Diagnosis not present

## 2019-10-26 DIAGNOSIS — J301 Allergic rhinitis due to pollen: Secondary | ICD-10-CM | POA: Diagnosis not present

## 2019-10-26 DIAGNOSIS — J449 Chronic obstructive pulmonary disease, unspecified: Secondary | ICD-10-CM | POA: Diagnosis not present

## 2019-10-27 DIAGNOSIS — C3412 Malignant neoplasm of upper lobe, left bronchus or lung: Secondary | ICD-10-CM | POA: Diagnosis not present

## 2019-10-27 DIAGNOSIS — I2510 Atherosclerotic heart disease of native coronary artery without angina pectoris: Secondary | ICD-10-CM | POA: Diagnosis not present

## 2019-10-27 DIAGNOSIS — E039 Hypothyroidism, unspecified: Secondary | ICD-10-CM | POA: Diagnosis not present

## 2019-10-27 DIAGNOSIS — M25512 Pain in left shoulder: Secondary | ICD-10-CM | POA: Diagnosis not present

## 2019-10-29 DIAGNOSIS — J449 Chronic obstructive pulmonary disease, unspecified: Secondary | ICD-10-CM | POA: Diagnosis not present

## 2019-11-05 DIAGNOSIS — J449 Chronic obstructive pulmonary disease, unspecified: Secondary | ICD-10-CM | POA: Diagnosis not present

## 2019-11-10 DIAGNOSIS — C3412 Malignant neoplasm of upper lobe, left bronchus or lung: Secondary | ICD-10-CM | POA: Diagnosis not present

## 2019-11-10 DIAGNOSIS — M25551 Pain in right hip: Secondary | ICD-10-CM | POA: Diagnosis not present

## 2019-11-10 DIAGNOSIS — E039 Hypothyroidism, unspecified: Secondary | ICD-10-CM | POA: Diagnosis not present

## 2019-11-10 DIAGNOSIS — I2510 Atherosclerotic heart disease of native coronary artery without angina pectoris: Secondary | ICD-10-CM | POA: Diagnosis not present

## 2019-11-15 DIAGNOSIS — E039 Hypothyroidism, unspecified: Secondary | ICD-10-CM | POA: Diagnosis not present

## 2019-11-15 DIAGNOSIS — Z20822 Contact with and (suspected) exposure to covid-19: Secondary | ICD-10-CM | POA: Diagnosis not present

## 2019-11-15 DIAGNOSIS — R609 Edema, unspecified: Secondary | ICD-10-CM | POA: Diagnosis not present

## 2019-11-15 DIAGNOSIS — C3412 Malignant neoplasm of upper lobe, left bronchus or lung: Secondary | ICD-10-CM | POA: Diagnosis not present

## 2019-11-17 DIAGNOSIS — C3412 Malignant neoplasm of upper lobe, left bronchus or lung: Secondary | ICD-10-CM | POA: Diagnosis not present

## 2019-11-17 DIAGNOSIS — R609 Edema, unspecified: Secondary | ICD-10-CM | POA: Diagnosis not present

## 2019-11-17 DIAGNOSIS — E039 Hypothyroidism, unspecified: Secondary | ICD-10-CM | POA: Diagnosis not present

## 2019-11-17 DIAGNOSIS — K219 Gastro-esophageal reflux disease without esophagitis: Secondary | ICD-10-CM | POA: Diagnosis not present

## 2019-11-24 DIAGNOSIS — C3412 Malignant neoplasm of upper lobe, left bronchus or lung: Secondary | ICD-10-CM | POA: Diagnosis not present

## 2019-11-24 DIAGNOSIS — D509 Iron deficiency anemia, unspecified: Secondary | ICD-10-CM | POA: Diagnosis not present

## 2019-11-24 DIAGNOSIS — E039 Hypothyroidism, unspecified: Secondary | ICD-10-CM | POA: Diagnosis not present

## 2019-11-24 DIAGNOSIS — I10 Essential (primary) hypertension: Secondary | ICD-10-CM | POA: Diagnosis not present

## 2019-11-24 DIAGNOSIS — E785 Hyperlipidemia, unspecified: Secondary | ICD-10-CM | POA: Diagnosis not present

## 2019-11-24 DIAGNOSIS — J0100 Acute maxillary sinusitis, unspecified: Secondary | ICD-10-CM | POA: Diagnosis not present

## 2019-11-24 DIAGNOSIS — F17200 Nicotine dependence, unspecified, uncomplicated: Secondary | ICD-10-CM | POA: Diagnosis not present

## 2019-11-29 DIAGNOSIS — J449 Chronic obstructive pulmonary disease, unspecified: Secondary | ICD-10-CM | POA: Diagnosis not present

## 2019-12-05 DIAGNOSIS — J449 Chronic obstructive pulmonary disease, unspecified: Secondary | ICD-10-CM | POA: Diagnosis not present

## 2019-12-08 DIAGNOSIS — C3412 Malignant neoplasm of upper lobe, left bronchus or lung: Secondary | ICD-10-CM | POA: Diagnosis not present

## 2019-12-08 DIAGNOSIS — R609 Edema, unspecified: Secondary | ICD-10-CM | POA: Diagnosis not present

## 2019-12-08 DIAGNOSIS — F17200 Nicotine dependence, unspecified, uncomplicated: Secondary | ICD-10-CM | POA: Diagnosis not present

## 2019-12-08 DIAGNOSIS — I10 Essential (primary) hypertension: Secondary | ICD-10-CM | POA: Diagnosis not present

## 2019-12-28 DIAGNOSIS — D509 Iron deficiency anemia, unspecified: Secondary | ICD-10-CM | POA: Diagnosis not present

## 2019-12-28 DIAGNOSIS — R5382 Chronic fatigue, unspecified: Secondary | ICD-10-CM | POA: Diagnosis not present

## 2019-12-29 DIAGNOSIS — J449 Chronic obstructive pulmonary disease, unspecified: Secondary | ICD-10-CM | POA: Diagnosis not present

## 2020-06-24 IMAGING — MG DIGITAL SCREENING BILATERAL MAMMOGRAM WITH CAD
4 series · 4 of 4 positions shown · non-contrast
Comparison: Previous exam(s).

CLINICAL DATA: Screening.

EXAM:
DIGITAL SCREENING BILATERAL MAMMOGRAM WITH CAD

[R MLO]
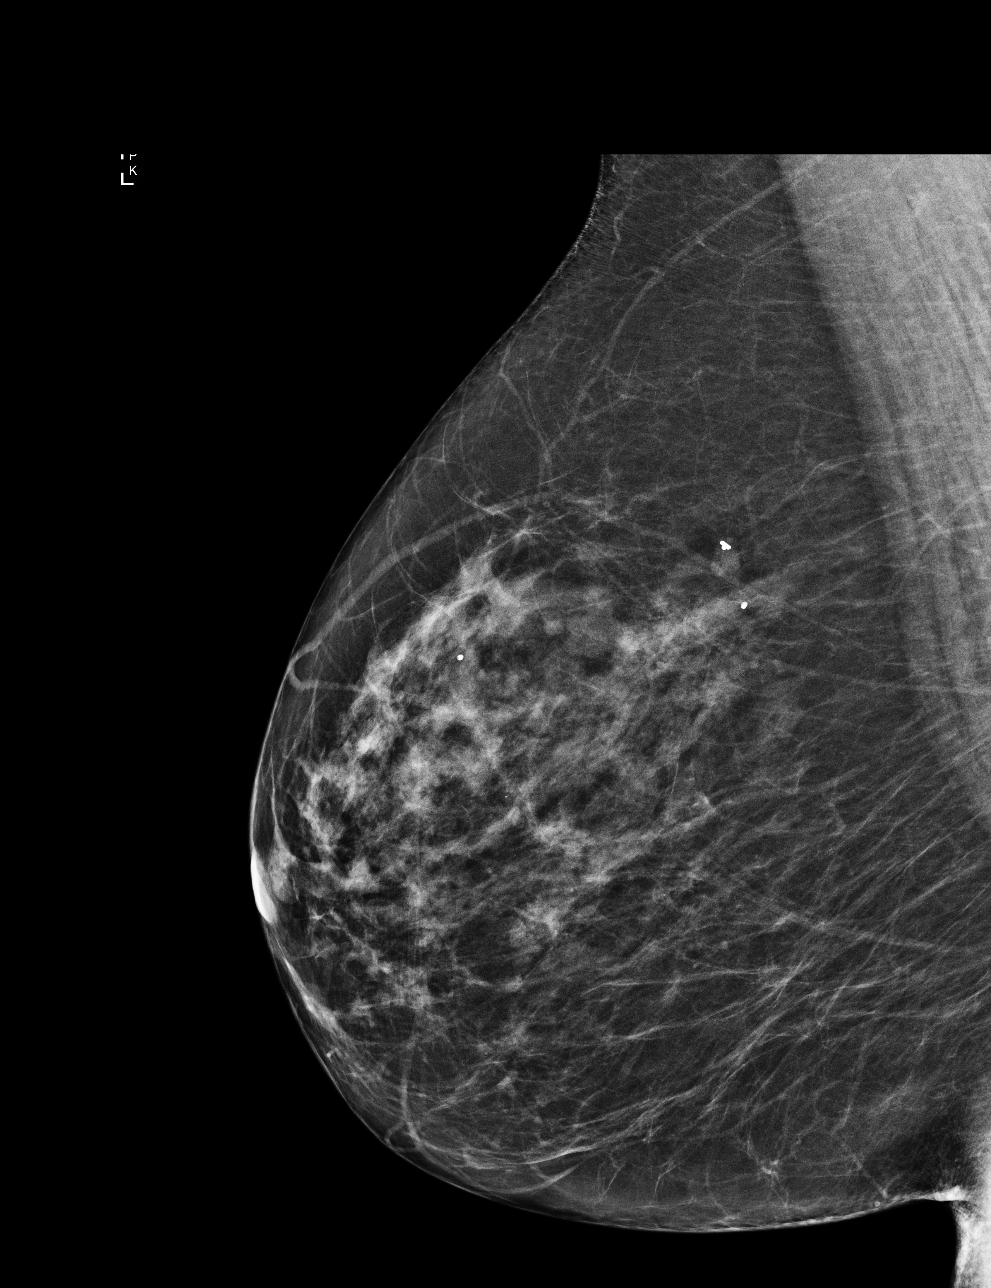

[R CC]
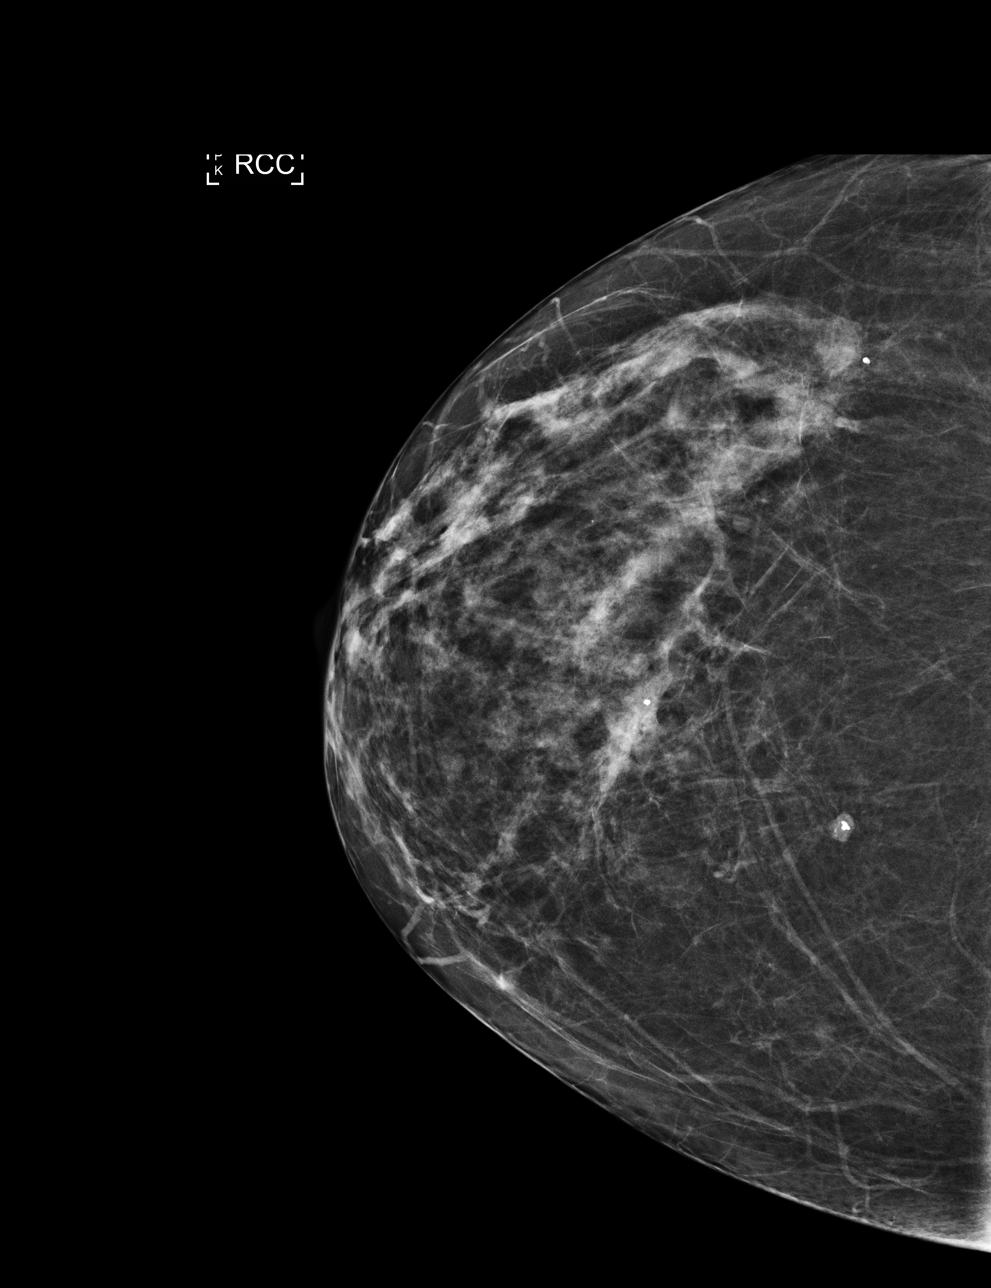

[L MLO]
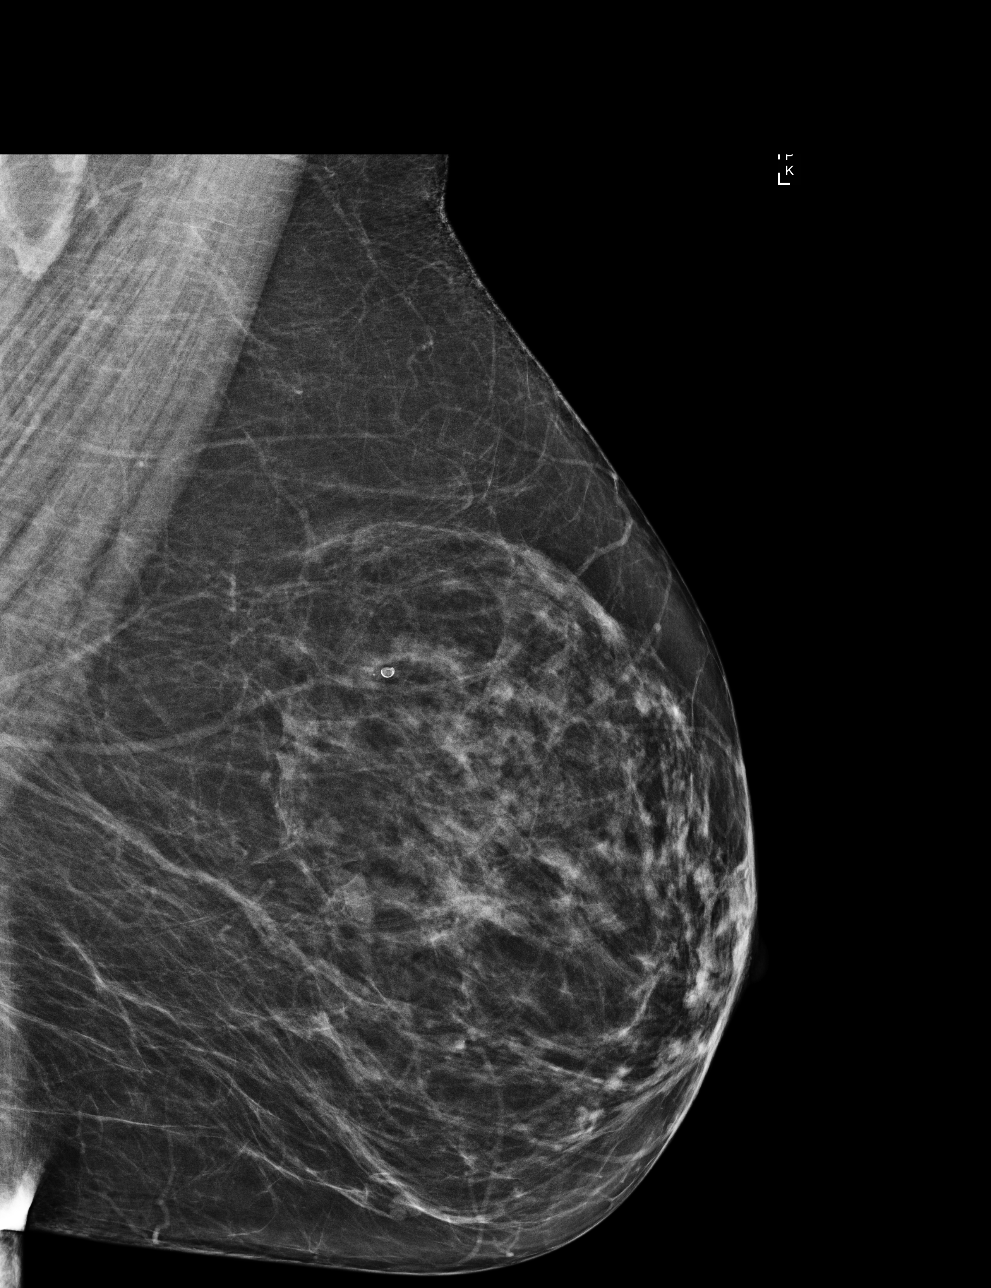

[L CC]
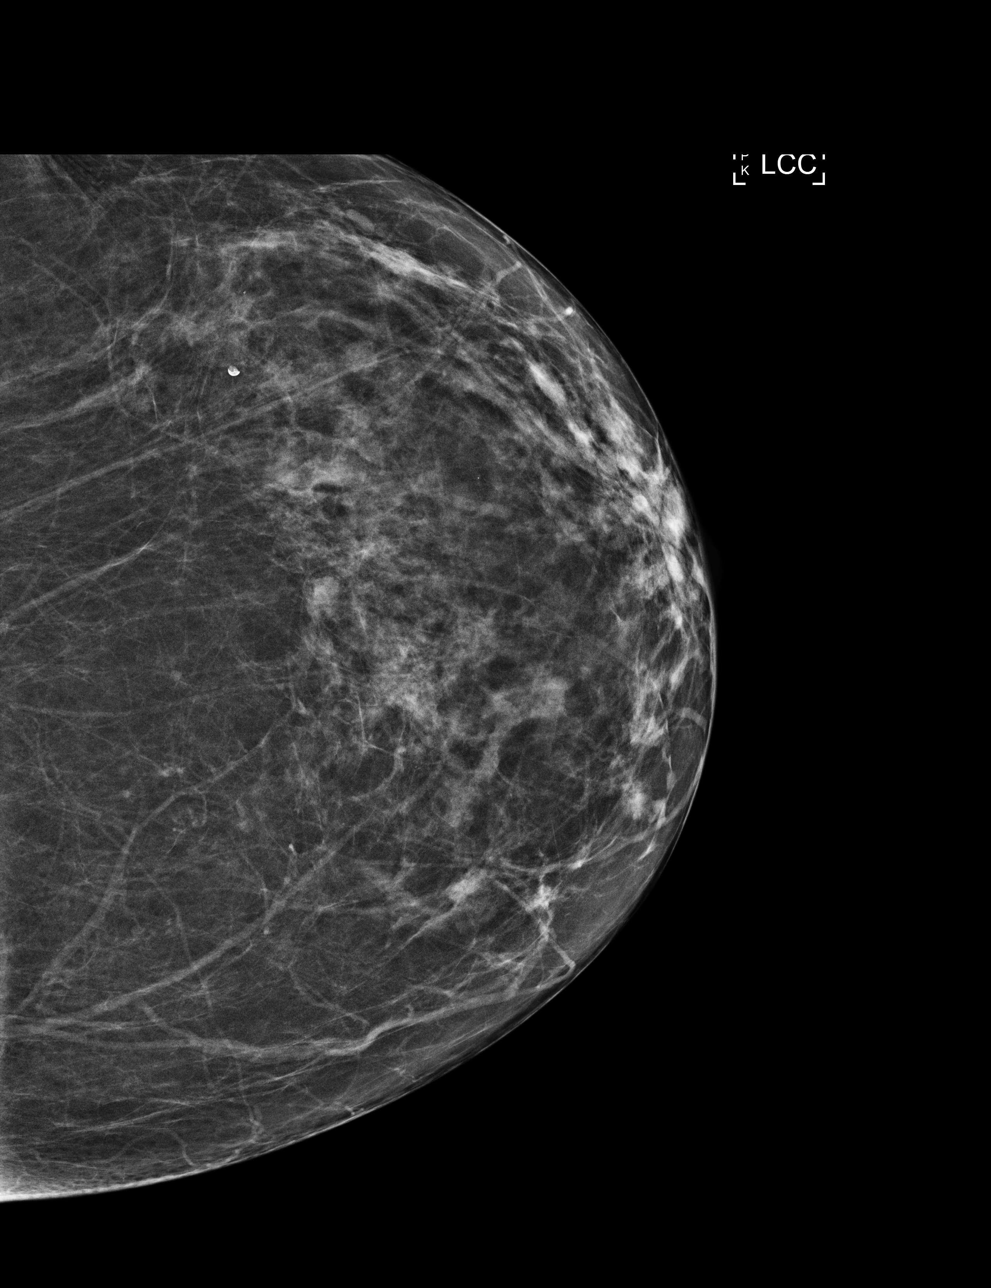

[4 of 4 positions shown; findings below may reference images not displayed]

ACR Breast Density Category b: There are scattered areas of
fibroglandular density.
FINDINGS: There are no findings suspicious for malignancy. Images were
processed with CAD.
IMPRESSION: No mammographic evidence of malignancy. A result letter of this
screening mammogram will be mailed directly to the patient.

RECOMMENDATION:
Screening mammogram in one year. (Code:AS-G-LCT)

BI-RADS CATEGORY  1: Negative.

## 2020-09-02 IMAGING — DX CHEST - 2 VIEW
2 series · 2 of 2 positions shown · non-contrast
Comparison: Chest radiograph March 11, 2016 and chest CT July 02, 2017

CLINICAL DATA: Chest pain

EXAM:
CHEST - 2 VIEW

[chest pa]
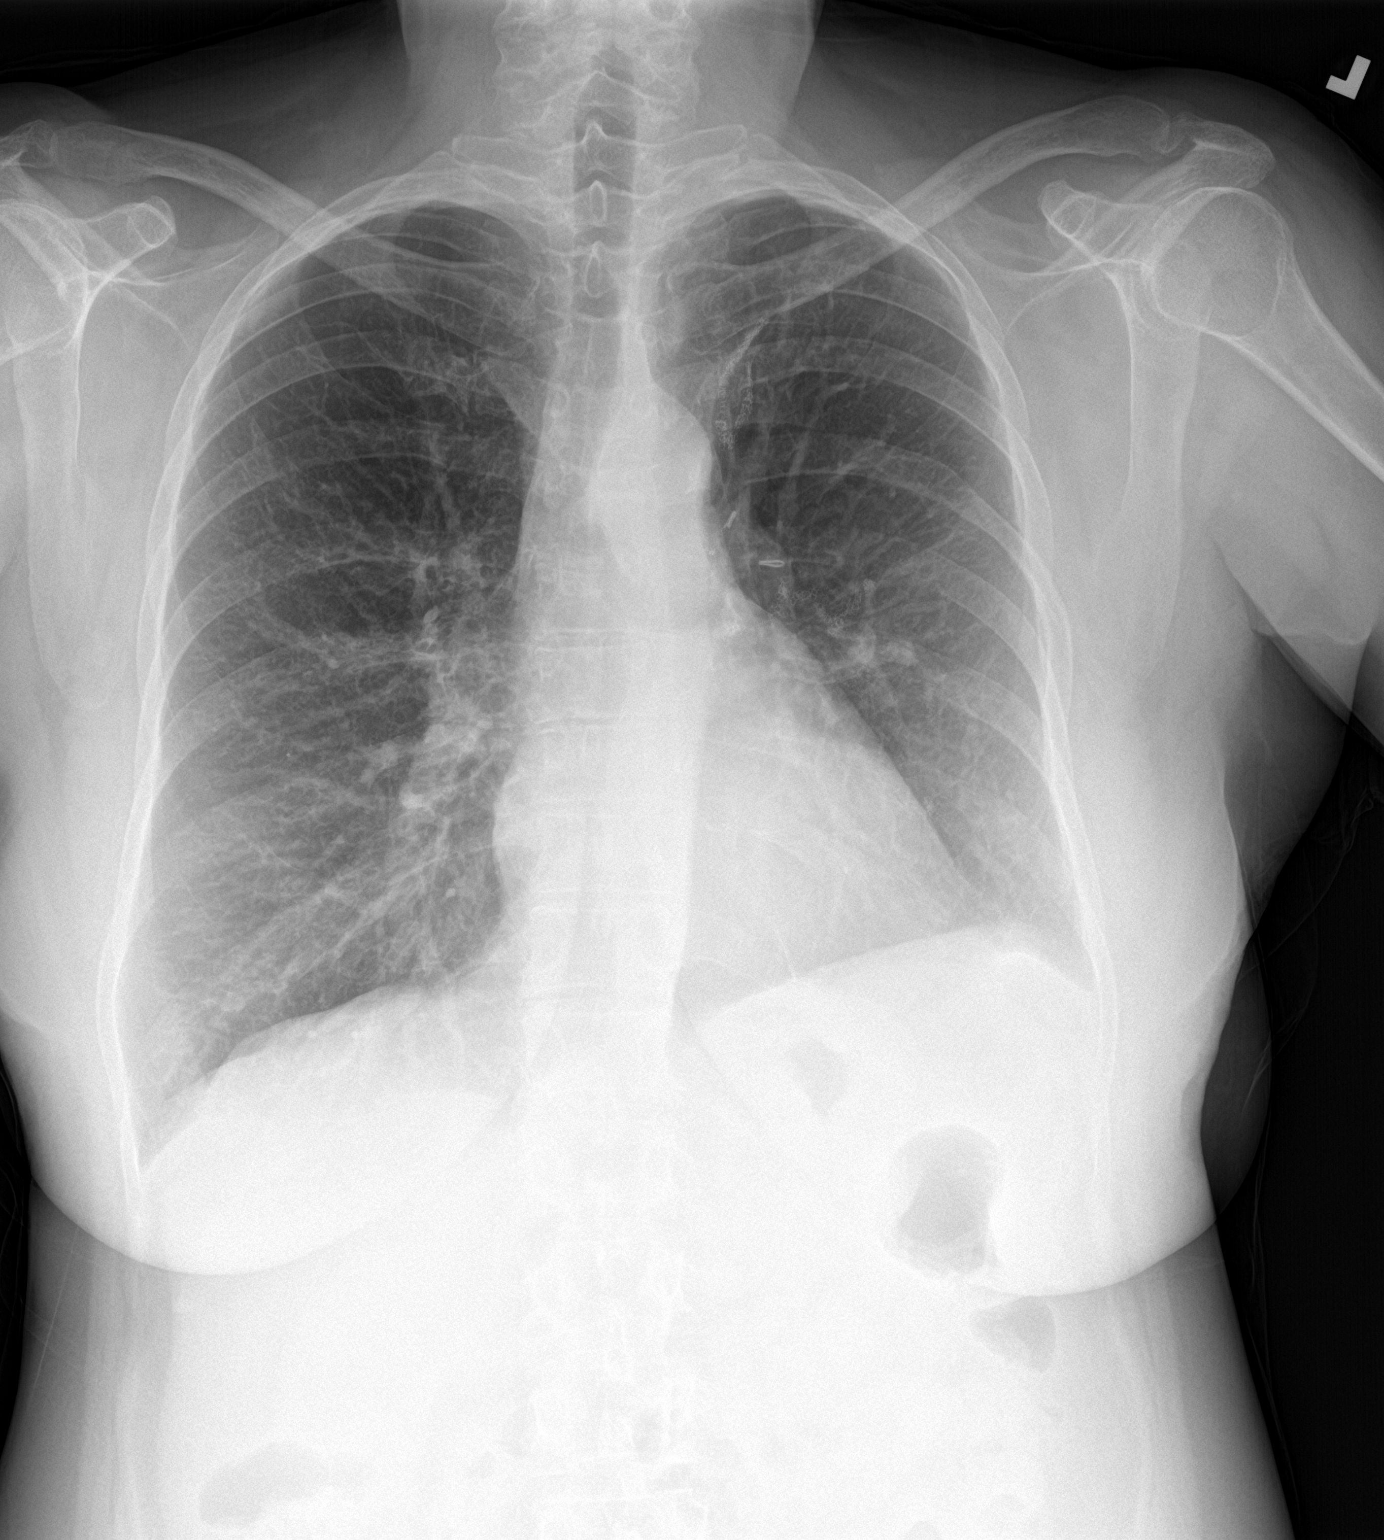

[chest lat]
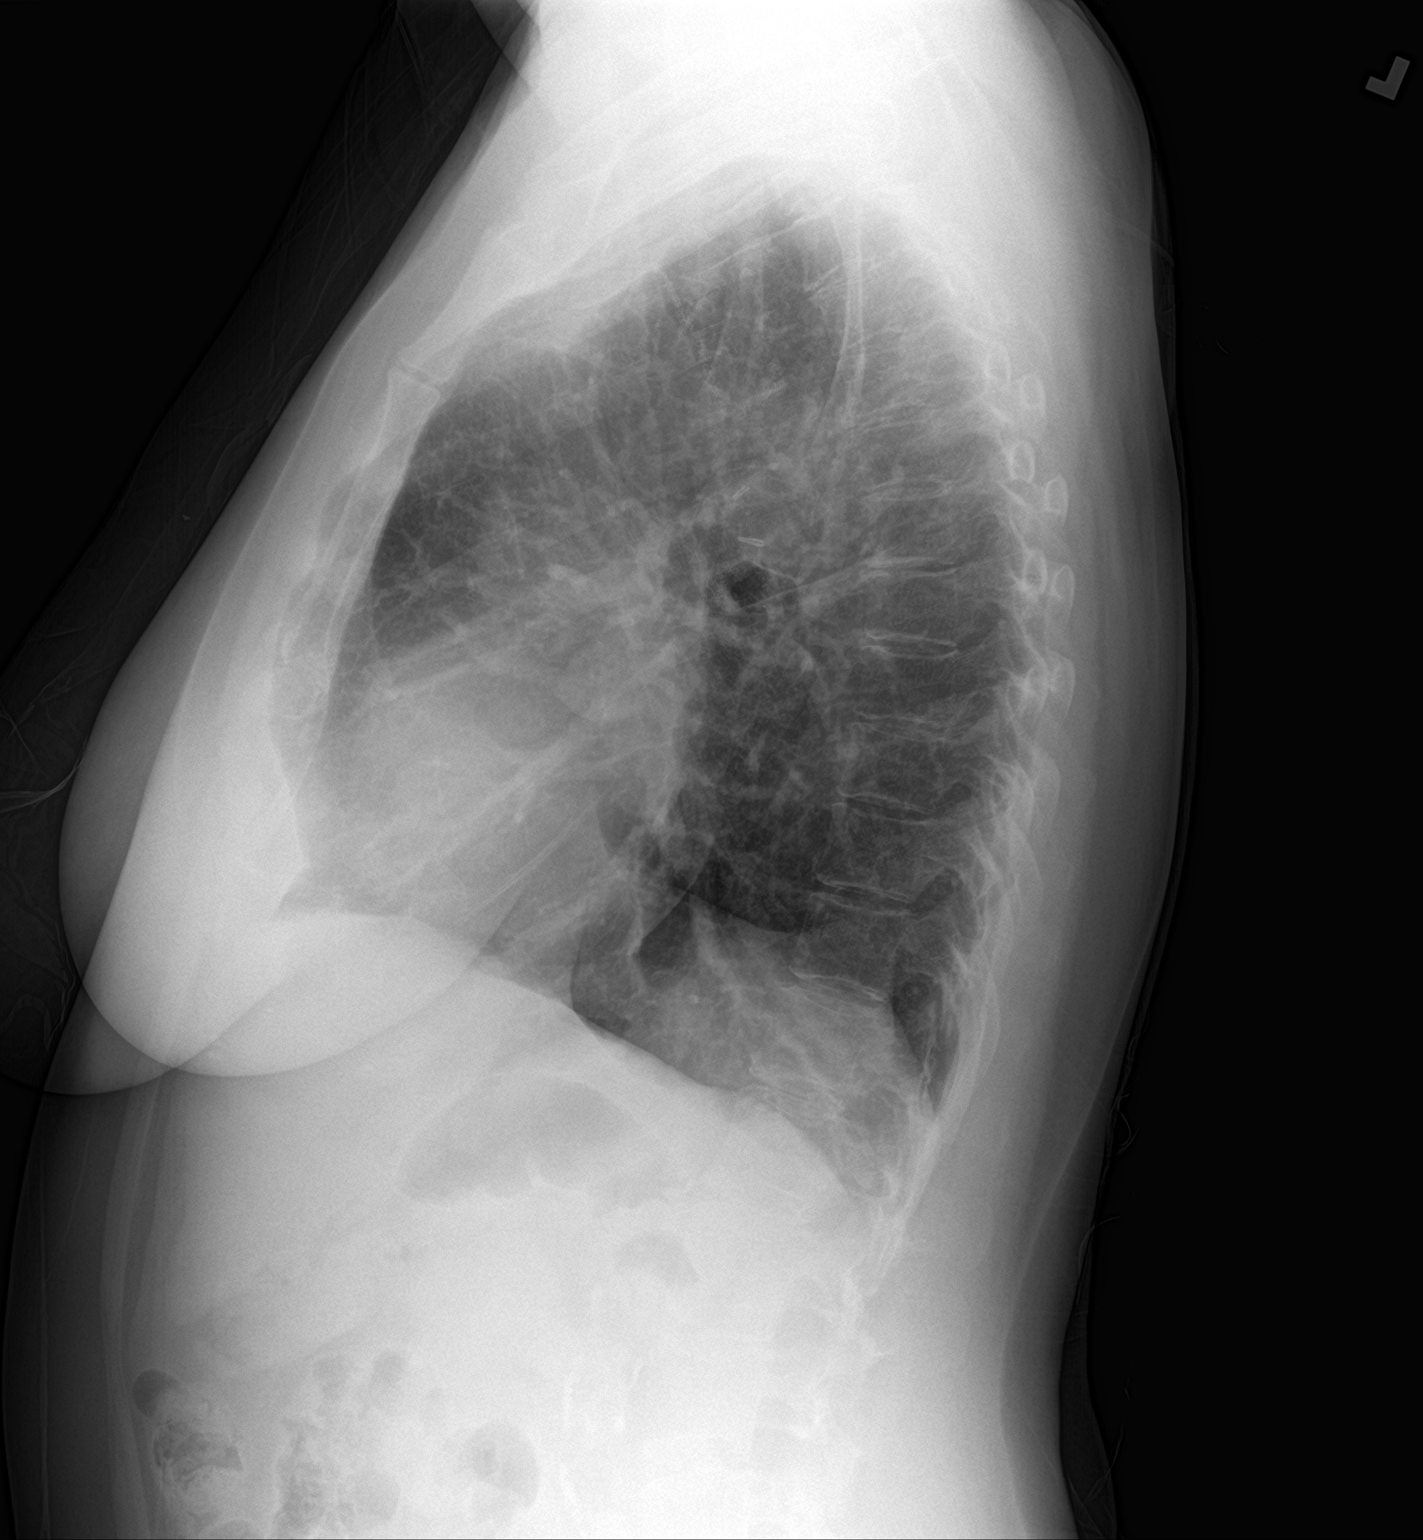

[2 of 2 positions shown; findings below may reference images not displayed]

FINDINGS: There is postoperative change in the left perihilar region with mild
volume loss on the left. There is no edema or consolidation. Heart
size and pulmonary vascularity are normal. No adenopathy. No bone
lesions.
IMPRESSION: Stable postoperative change on the left. No edema or consolidation.
Stable cardiac silhouette.

## 2020-09-03 DIAGNOSIS — J309 Allergic rhinitis, unspecified: Secondary | ICD-10-CM | POA: Diagnosis not present

## 2020-09-03 DIAGNOSIS — Z85118 Personal history of other malignant neoplasm of bronchus and lung: Secondary | ICD-10-CM | POA: Diagnosis not present

## 2020-09-03 DIAGNOSIS — D509 Iron deficiency anemia, unspecified: Secondary | ICD-10-CM | POA: Diagnosis not present

## 2020-09-03 DIAGNOSIS — J208 Acute bronchitis due to other specified organisms: Secondary | ICD-10-CM | POA: Diagnosis not present

## 2020-09-03 DIAGNOSIS — J449 Chronic obstructive pulmonary disease, unspecified: Secondary | ICD-10-CM | POA: Diagnosis not present

## 2020-09-03 DIAGNOSIS — R519 Headache, unspecified: Secondary | ICD-10-CM | POA: Diagnosis not present

## 2020-09-03 DIAGNOSIS — K219 Gastro-esophageal reflux disease without esophagitis: Secondary | ICD-10-CM | POA: Diagnosis not present

## 2020-09-03 DIAGNOSIS — B9689 Other specified bacterial agents as the cause of diseases classified elsewhere: Secondary | ICD-10-CM | POA: Diagnosis not present

## 2020-09-03 DIAGNOSIS — R609 Edema, unspecified: Secondary | ICD-10-CM | POA: Diagnosis not present

## 2020-09-03 DIAGNOSIS — J439 Emphysema, unspecified: Secondary | ICD-10-CM | POA: Diagnosis not present

## 2020-09-03 DIAGNOSIS — F17200 Nicotine dependence, unspecified, uncomplicated: Secondary | ICD-10-CM | POA: Diagnosis not present

## 2020-09-03 DIAGNOSIS — J9611 Chronic respiratory failure with hypoxia: Secondary | ICD-10-CM | POA: Diagnosis not present

## 2020-09-03 DIAGNOSIS — I2510 Atherosclerotic heart disease of native coronary artery without angina pectoris: Secondary | ICD-10-CM | POA: Diagnosis not present

## 2020-09-10 DIAGNOSIS — J309 Allergic rhinitis, unspecified: Secondary | ICD-10-CM | POA: Diagnosis not present

## 2020-09-10 DIAGNOSIS — R609 Edema, unspecified: Secondary | ICD-10-CM | POA: Diagnosis not present

## 2020-09-10 DIAGNOSIS — B9689 Other specified bacterial agents as the cause of diseases classified elsewhere: Secondary | ICD-10-CM | POA: Diagnosis not present

## 2020-09-10 DIAGNOSIS — R519 Headache, unspecified: Secondary | ICD-10-CM | POA: Diagnosis not present

## 2020-09-10 DIAGNOSIS — Z85118 Personal history of other malignant neoplasm of bronchus and lung: Secondary | ICD-10-CM | POA: Diagnosis not present

## 2020-09-10 DIAGNOSIS — J208 Acute bronchitis due to other specified organisms: Secondary | ICD-10-CM | POA: Diagnosis not present

## 2020-09-10 DIAGNOSIS — J439 Emphysema, unspecified: Secondary | ICD-10-CM | POA: Diagnosis not present

## 2020-09-10 DIAGNOSIS — I2510 Atherosclerotic heart disease of native coronary artery without angina pectoris: Secondary | ICD-10-CM | POA: Diagnosis not present

## 2020-09-10 DIAGNOSIS — J9611 Chronic respiratory failure with hypoxia: Secondary | ICD-10-CM | POA: Diagnosis not present

## 2020-09-10 DIAGNOSIS — K219 Gastro-esophageal reflux disease without esophagitis: Secondary | ICD-10-CM | POA: Diagnosis not present

## 2020-09-10 DIAGNOSIS — D509 Iron deficiency anemia, unspecified: Secondary | ICD-10-CM | POA: Diagnosis not present

## 2020-09-16 DIAGNOSIS — R519 Headache, unspecified: Secondary | ICD-10-CM | POA: Diagnosis not present

## 2020-09-16 DIAGNOSIS — K219 Gastro-esophageal reflux disease without esophagitis: Secondary | ICD-10-CM | POA: Diagnosis not present

## 2020-09-16 DIAGNOSIS — J309 Allergic rhinitis, unspecified: Secondary | ICD-10-CM | POA: Diagnosis not present

## 2020-09-16 DIAGNOSIS — J439 Emphysema, unspecified: Secondary | ICD-10-CM | POA: Diagnosis not present

## 2020-09-16 DIAGNOSIS — R609 Edema, unspecified: Secondary | ICD-10-CM | POA: Diagnosis not present

## 2020-09-16 DIAGNOSIS — B9689 Other specified bacterial agents as the cause of diseases classified elsewhere: Secondary | ICD-10-CM | POA: Diagnosis not present

## 2020-09-16 DIAGNOSIS — J9611 Chronic respiratory failure with hypoxia: Secondary | ICD-10-CM | POA: Diagnosis not present

## 2020-09-16 DIAGNOSIS — D509 Iron deficiency anemia, unspecified: Secondary | ICD-10-CM | POA: Diagnosis not present

## 2020-09-16 DIAGNOSIS — I2510 Atherosclerotic heart disease of native coronary artery without angina pectoris: Secondary | ICD-10-CM | POA: Diagnosis not present

## 2020-09-16 DIAGNOSIS — J208 Acute bronchitis due to other specified organisms: Secondary | ICD-10-CM | POA: Diagnosis not present

## 2020-09-16 DIAGNOSIS — Z85118 Personal history of other malignant neoplasm of bronchus and lung: Secondary | ICD-10-CM | POA: Diagnosis not present

## 2020-09-27 DIAGNOSIS — J449 Chronic obstructive pulmonary disease, unspecified: Secondary | ICD-10-CM | POA: Diagnosis not present

## 2020-09-30 DIAGNOSIS — E785 Hyperlipidemia, unspecified: Secondary | ICD-10-CM | POA: Diagnosis not present

## 2020-09-30 DIAGNOSIS — Z85118 Personal history of other malignant neoplasm of bronchus and lung: Secondary | ICD-10-CM | POA: Diagnosis not present

## 2020-09-30 DIAGNOSIS — R519 Headache, unspecified: Secondary | ICD-10-CM | POA: Diagnosis not present

## 2020-09-30 DIAGNOSIS — J9611 Chronic respiratory failure with hypoxia: Secondary | ICD-10-CM | POA: Diagnosis not present

## 2020-09-30 DIAGNOSIS — K219 Gastro-esophageal reflux disease without esophagitis: Secondary | ICD-10-CM | POA: Diagnosis not present

## 2020-09-30 DIAGNOSIS — J439 Emphysema, unspecified: Secondary | ICD-10-CM | POA: Diagnosis not present

## 2020-09-30 DIAGNOSIS — J309 Allergic rhinitis, unspecified: Secondary | ICD-10-CM | POA: Diagnosis not present

## 2020-09-30 DIAGNOSIS — R609 Edema, unspecified: Secondary | ICD-10-CM | POA: Diagnosis not present

## 2020-09-30 DIAGNOSIS — D509 Iron deficiency anemia, unspecified: Secondary | ICD-10-CM | POA: Diagnosis not present

## 2020-09-30 DIAGNOSIS — I2510 Atherosclerotic heart disease of native coronary artery without angina pectoris: Secondary | ICD-10-CM | POA: Diagnosis not present

## 2020-09-30 DIAGNOSIS — I10 Essential (primary) hypertension: Secondary | ICD-10-CM | POA: Diagnosis not present

## 2020-10-01 DIAGNOSIS — J449 Chronic obstructive pulmonary disease, unspecified: Secondary | ICD-10-CM | POA: Diagnosis not present

## 2020-10-01 DIAGNOSIS — J301 Allergic rhinitis due to pollen: Secondary | ICD-10-CM | POA: Diagnosis not present

## 2020-10-01 DIAGNOSIS — F17210 Nicotine dependence, cigarettes, uncomplicated: Secondary | ICD-10-CM | POA: Diagnosis not present

## 2020-10-15 DIAGNOSIS — Z87891 Personal history of nicotine dependence: Secondary | ICD-10-CM | POA: Diagnosis not present

## 2020-10-15 DIAGNOSIS — F17210 Nicotine dependence, cigarettes, uncomplicated: Secondary | ICD-10-CM | POA: Diagnosis not present

## 2020-10-15 DIAGNOSIS — E039 Hypothyroidism, unspecified: Secondary | ICD-10-CM | POA: Diagnosis not present

## 2020-10-15 DIAGNOSIS — I10 Essential (primary) hypertension: Secondary | ICD-10-CM | POA: Diagnosis not present

## 2020-10-15 DIAGNOSIS — D509 Iron deficiency anemia, unspecified: Secondary | ICD-10-CM | POA: Diagnosis not present

## 2020-10-15 DIAGNOSIS — Z6822 Body mass index (BMI) 22.0-22.9, adult: Secondary | ICD-10-CM | POA: Diagnosis not present

## 2020-10-15 DIAGNOSIS — E785 Hyperlipidemia, unspecified: Secondary | ICD-10-CM | POA: Diagnosis not present

## 2020-10-29 DIAGNOSIS — Z85118 Personal history of other malignant neoplasm of bronchus and lung: Secondary | ICD-10-CM | POA: Diagnosis not present

## 2020-10-29 DIAGNOSIS — E785 Hyperlipidemia, unspecified: Secondary | ICD-10-CM | POA: Diagnosis not present

## 2020-10-29 DIAGNOSIS — I2510 Atherosclerotic heart disease of native coronary artery without angina pectoris: Secondary | ICD-10-CM | POA: Diagnosis not present

## 2020-10-29 DIAGNOSIS — J439 Emphysema, unspecified: Secondary | ICD-10-CM | POA: Diagnosis not present

## 2020-10-29 DIAGNOSIS — J309 Allergic rhinitis, unspecified: Secondary | ICD-10-CM | POA: Diagnosis not present

## 2020-10-29 DIAGNOSIS — R519 Headache, unspecified: Secondary | ICD-10-CM | POA: Diagnosis not present

## 2020-10-29 DIAGNOSIS — R609 Edema, unspecified: Secondary | ICD-10-CM | POA: Diagnosis not present

## 2020-10-29 DIAGNOSIS — E039 Hypothyroidism, unspecified: Secondary | ICD-10-CM | POA: Diagnosis not present

## 2020-10-29 DIAGNOSIS — J9611 Chronic respiratory failure with hypoxia: Secondary | ICD-10-CM | POA: Diagnosis not present

## 2020-10-29 DIAGNOSIS — K219 Gastro-esophageal reflux disease without esophagitis: Secondary | ICD-10-CM | POA: Diagnosis not present

## 2020-10-31 DIAGNOSIS — J449 Chronic obstructive pulmonary disease, unspecified: Secondary | ICD-10-CM | POA: Diagnosis not present

## 2020-11-06 DIAGNOSIS — J449 Chronic obstructive pulmonary disease, unspecified: Secondary | ICD-10-CM | POA: Diagnosis not present

## 2020-11-08 DIAGNOSIS — J301 Allergic rhinitis due to pollen: Secondary | ICD-10-CM | POA: Diagnosis not present

## 2020-11-08 DIAGNOSIS — J449 Chronic obstructive pulmonary disease, unspecified: Secondary | ICD-10-CM | POA: Diagnosis not present

## 2020-11-08 DIAGNOSIS — F17210 Nicotine dependence, cigarettes, uncomplicated: Secondary | ICD-10-CM | POA: Diagnosis not present

## 2020-11-26 DIAGNOSIS — J439 Emphysema, unspecified: Secondary | ICD-10-CM | POA: Diagnosis not present

## 2020-11-26 DIAGNOSIS — Z85118 Personal history of other malignant neoplasm of bronchus and lung: Secondary | ICD-10-CM | POA: Diagnosis not present

## 2020-11-26 DIAGNOSIS — E039 Hypothyroidism, unspecified: Secondary | ICD-10-CM | POA: Diagnosis not present

## 2020-11-26 DIAGNOSIS — E785 Hyperlipidemia, unspecified: Secondary | ICD-10-CM | POA: Diagnosis not present

## 2020-11-26 DIAGNOSIS — I2510 Atherosclerotic heart disease of native coronary artery without angina pectoris: Secondary | ICD-10-CM | POA: Diagnosis not present

## 2020-11-26 DIAGNOSIS — J9611 Chronic respiratory failure with hypoxia: Secondary | ICD-10-CM | POA: Diagnosis not present

## 2020-11-26 DIAGNOSIS — R609 Edema, unspecified: Secondary | ICD-10-CM | POA: Diagnosis not present

## 2020-11-26 DIAGNOSIS — R519 Headache, unspecified: Secondary | ICD-10-CM | POA: Diagnosis not present

## 2020-11-26 DIAGNOSIS — K219 Gastro-esophageal reflux disease without esophagitis: Secondary | ICD-10-CM | POA: Diagnosis not present

## 2020-11-26 DIAGNOSIS — J309 Allergic rhinitis, unspecified: Secondary | ICD-10-CM | POA: Diagnosis not present

## 2020-12-04 DIAGNOSIS — J449 Chronic obstructive pulmonary disease, unspecified: Secondary | ICD-10-CM | POA: Diagnosis not present

## 2020-12-24 DIAGNOSIS — E039 Hypothyroidism, unspecified: Secondary | ICD-10-CM | POA: Diagnosis not present

## 2020-12-24 DIAGNOSIS — K219 Gastro-esophageal reflux disease without esophagitis: Secondary | ICD-10-CM | POA: Diagnosis not present

## 2020-12-24 DIAGNOSIS — I2510 Atherosclerotic heart disease of native coronary artery without angina pectoris: Secondary | ICD-10-CM | POA: Diagnosis not present

## 2020-12-24 DIAGNOSIS — J9611 Chronic respiratory failure with hypoxia: Secondary | ICD-10-CM | POA: Diagnosis not present

## 2020-12-24 DIAGNOSIS — Z85118 Personal history of other malignant neoplasm of bronchus and lung: Secondary | ICD-10-CM | POA: Diagnosis not present

## 2020-12-24 DIAGNOSIS — R609 Edema, unspecified: Secondary | ICD-10-CM | POA: Diagnosis not present

## 2020-12-24 DIAGNOSIS — J309 Allergic rhinitis, unspecified: Secondary | ICD-10-CM | POA: Diagnosis not present

## 2020-12-24 DIAGNOSIS — J439 Emphysema, unspecified: Secondary | ICD-10-CM | POA: Diagnosis not present

## 2020-12-24 DIAGNOSIS — R519 Headache, unspecified: Secondary | ICD-10-CM | POA: Diagnosis not present

## 2020-12-24 DIAGNOSIS — E785 Hyperlipidemia, unspecified: Secondary | ICD-10-CM | POA: Diagnosis not present

## 2021-01-04 DIAGNOSIS — J449 Chronic obstructive pulmonary disease, unspecified: Secondary | ICD-10-CM | POA: Diagnosis not present

## 2021-01-07 DIAGNOSIS — J439 Emphysema, unspecified: Secondary | ICD-10-CM | POA: Diagnosis not present

## 2021-01-07 DIAGNOSIS — B9689 Other specified bacterial agents as the cause of diseases classified elsewhere: Secondary | ICD-10-CM | POA: Diagnosis not present

## 2021-01-07 DIAGNOSIS — I2510 Atherosclerotic heart disease of native coronary artery without angina pectoris: Secondary | ICD-10-CM | POA: Diagnosis not present

## 2021-01-07 DIAGNOSIS — K219 Gastro-esophageal reflux disease without esophagitis: Secondary | ICD-10-CM | POA: Diagnosis not present

## 2021-01-07 DIAGNOSIS — E039 Hypothyroidism, unspecified: Secondary | ICD-10-CM | POA: Diagnosis not present

## 2021-01-07 DIAGNOSIS — J208 Acute bronchitis due to other specified organisms: Secondary | ICD-10-CM | POA: Diagnosis not present

## 2021-01-07 DIAGNOSIS — J309 Allergic rhinitis, unspecified: Secondary | ICD-10-CM | POA: Diagnosis not present

## 2021-01-07 DIAGNOSIS — R609 Edema, unspecified: Secondary | ICD-10-CM | POA: Diagnosis not present

## 2021-01-07 DIAGNOSIS — R112 Nausea with vomiting, unspecified: Secondary | ICD-10-CM | POA: Diagnosis not present

## 2021-01-07 DIAGNOSIS — Z85118 Personal history of other malignant neoplasm of bronchus and lung: Secondary | ICD-10-CM | POA: Diagnosis not present

## 2021-01-07 DIAGNOSIS — J9611 Chronic respiratory failure with hypoxia: Secondary | ICD-10-CM | POA: Diagnosis not present

## 2021-01-07 DIAGNOSIS — E785 Hyperlipidemia, unspecified: Secondary | ICD-10-CM | POA: Diagnosis not present

## 2021-01-21 DIAGNOSIS — D509 Iron deficiency anemia, unspecified: Secondary | ICD-10-CM | POA: Diagnosis not present

## 2021-01-21 DIAGNOSIS — I10 Essential (primary) hypertension: Secondary | ICD-10-CM | POA: Diagnosis not present

## 2021-01-21 DIAGNOSIS — I2510 Atherosclerotic heart disease of native coronary artery without angina pectoris: Secondary | ICD-10-CM | POA: Diagnosis not present

## 2021-01-21 DIAGNOSIS — J9611 Chronic respiratory failure with hypoxia: Secondary | ICD-10-CM | POA: Diagnosis not present

## 2021-01-21 DIAGNOSIS — E785 Hyperlipidemia, unspecified: Secondary | ICD-10-CM | POA: Diagnosis not present

## 2021-01-21 DIAGNOSIS — R609 Edema, unspecified: Secondary | ICD-10-CM | POA: Diagnosis not present

## 2021-01-21 DIAGNOSIS — K219 Gastro-esophageal reflux disease without esophagitis: Secondary | ICD-10-CM | POA: Diagnosis not present

## 2021-01-21 DIAGNOSIS — J439 Emphysema, unspecified: Secondary | ICD-10-CM | POA: Diagnosis not present

## 2021-01-21 DIAGNOSIS — M1990 Unspecified osteoarthritis, unspecified site: Secondary | ICD-10-CM | POA: Diagnosis not present

## 2021-01-21 DIAGNOSIS — E039 Hypothyroidism, unspecified: Secondary | ICD-10-CM | POA: Diagnosis not present

## 2021-01-21 DIAGNOSIS — Z85118 Personal history of other malignant neoplasm of bronchus and lung: Secondary | ICD-10-CM | POA: Diagnosis not present

## 2021-01-21 DIAGNOSIS — H6690 Otitis media, unspecified, unspecified ear: Secondary | ICD-10-CM | POA: Diagnosis not present

## 2021-01-21 DIAGNOSIS — J309 Allergic rhinitis, unspecified: Secondary | ICD-10-CM | POA: Diagnosis not present

## 2021-01-24 DIAGNOSIS — Z85118 Personal history of other malignant neoplasm of bronchus and lung: Secondary | ICD-10-CM | POA: Diagnosis not present

## 2021-01-24 DIAGNOSIS — J9611 Chronic respiratory failure with hypoxia: Secondary | ICD-10-CM | POA: Diagnosis not present

## 2021-01-24 DIAGNOSIS — R609 Edema, unspecified: Secondary | ICD-10-CM | POA: Diagnosis not present

## 2021-01-24 DIAGNOSIS — K219 Gastro-esophageal reflux disease without esophagitis: Secondary | ICD-10-CM | POA: Diagnosis not present

## 2021-01-24 DIAGNOSIS — J439 Emphysema, unspecified: Secondary | ICD-10-CM | POA: Diagnosis not present

## 2021-01-24 DIAGNOSIS — R42 Dizziness and giddiness: Secondary | ICD-10-CM | POA: Diagnosis not present

## 2021-01-24 DIAGNOSIS — R519 Headache, unspecified: Secondary | ICD-10-CM | POA: Diagnosis not present

## 2021-01-24 DIAGNOSIS — I2510 Atherosclerotic heart disease of native coronary artery without angina pectoris: Secondary | ICD-10-CM | POA: Diagnosis not present

## 2021-01-24 DIAGNOSIS — E785 Hyperlipidemia, unspecified: Secondary | ICD-10-CM | POA: Diagnosis not present

## 2021-01-24 DIAGNOSIS — J309 Allergic rhinitis, unspecified: Secondary | ICD-10-CM | POA: Diagnosis not present

## 2021-01-24 DIAGNOSIS — E039 Hypothyroidism, unspecified: Secondary | ICD-10-CM | POA: Diagnosis not present

## 2021-01-24 DIAGNOSIS — M1990 Unspecified osteoarthritis, unspecified site: Secondary | ICD-10-CM | POA: Diagnosis not present

## 2021-02-03 DIAGNOSIS — J449 Chronic obstructive pulmonary disease, unspecified: Secondary | ICD-10-CM | POA: Diagnosis not present

## 2021-02-18 DIAGNOSIS — Z6821 Body mass index (BMI) 21.0-21.9, adult: Secondary | ICD-10-CM | POA: Diagnosis not present

## 2021-02-18 DIAGNOSIS — J439 Emphysema, unspecified: Secondary | ICD-10-CM | POA: Diagnosis not present

## 2021-02-18 DIAGNOSIS — K219 Gastro-esophageal reflux disease without esophagitis: Secondary | ICD-10-CM | POA: Diagnosis not present

## 2021-02-18 DIAGNOSIS — I2510 Atherosclerotic heart disease of native coronary artery without angina pectoris: Secondary | ICD-10-CM | POA: Diagnosis not present

## 2021-02-18 DIAGNOSIS — J9611 Chronic respiratory failure with hypoxia: Secondary | ICD-10-CM | POA: Diagnosis not present

## 2021-02-18 DIAGNOSIS — R609 Edema, unspecified: Secondary | ICD-10-CM | POA: Diagnosis not present

## 2021-02-18 DIAGNOSIS — M1990 Unspecified osteoarthritis, unspecified site: Secondary | ICD-10-CM | POA: Diagnosis not present

## 2021-02-18 DIAGNOSIS — Z85118 Personal history of other malignant neoplasm of bronchus and lung: Secondary | ICD-10-CM | POA: Diagnosis not present

## 2021-02-18 DIAGNOSIS — E039 Hypothyroidism, unspecified: Secondary | ICD-10-CM | POA: Diagnosis not present

## 2021-02-18 DIAGNOSIS — J309 Allergic rhinitis, unspecified: Secondary | ICD-10-CM | POA: Diagnosis not present

## 2021-03-06 DIAGNOSIS — J449 Chronic obstructive pulmonary disease, unspecified: Secondary | ICD-10-CM | POA: Diagnosis not present

## 2021-03-18 DIAGNOSIS — J9611 Chronic respiratory failure with hypoxia: Secondary | ICD-10-CM | POA: Diagnosis not present

## 2021-03-18 DIAGNOSIS — J439 Emphysema, unspecified: Secondary | ICD-10-CM | POA: Diagnosis not present

## 2021-03-18 DIAGNOSIS — M1990 Unspecified osteoarthritis, unspecified site: Secondary | ICD-10-CM | POA: Diagnosis not present

## 2021-03-18 DIAGNOSIS — K219 Gastro-esophageal reflux disease without esophagitis: Secondary | ICD-10-CM | POA: Diagnosis not present

## 2021-03-18 DIAGNOSIS — Z85118 Personal history of other malignant neoplasm of bronchus and lung: Secondary | ICD-10-CM | POA: Diagnosis not present

## 2021-03-18 DIAGNOSIS — R519 Headache, unspecified: Secondary | ICD-10-CM | POA: Diagnosis not present

## 2021-03-18 DIAGNOSIS — I2510 Atherosclerotic heart disease of native coronary artery without angina pectoris: Secondary | ICD-10-CM | POA: Diagnosis not present

## 2021-03-18 DIAGNOSIS — R609 Edema, unspecified: Secondary | ICD-10-CM | POA: Diagnosis not present

## 2021-03-18 DIAGNOSIS — J309 Allergic rhinitis, unspecified: Secondary | ICD-10-CM | POA: Diagnosis not present

## 2021-03-18 DIAGNOSIS — E785 Hyperlipidemia, unspecified: Secondary | ICD-10-CM | POA: Diagnosis not present

## 2021-03-18 DIAGNOSIS — E039 Hypothyroidism, unspecified: Secondary | ICD-10-CM | POA: Diagnosis not present

## 2021-04-06 DIAGNOSIS — J449 Chronic obstructive pulmonary disease, unspecified: Secondary | ICD-10-CM | POA: Diagnosis not present

## 2021-04-15 DIAGNOSIS — Z85118 Personal history of other malignant neoplasm of bronchus and lung: Secondary | ICD-10-CM | POA: Diagnosis not present

## 2021-04-15 DIAGNOSIS — J028 Acute pharyngitis due to other specified organisms: Secondary | ICD-10-CM | POA: Diagnosis not present

## 2021-04-15 DIAGNOSIS — K219 Gastro-esophageal reflux disease without esophagitis: Secondary | ICD-10-CM | POA: Diagnosis not present

## 2021-04-15 DIAGNOSIS — M1990 Unspecified osteoarthritis, unspecified site: Secondary | ICD-10-CM | POA: Diagnosis not present

## 2021-04-15 DIAGNOSIS — B9689 Other specified bacterial agents as the cause of diseases classified elsewhere: Secondary | ICD-10-CM | POA: Diagnosis not present

## 2021-04-15 DIAGNOSIS — J9611 Chronic respiratory failure with hypoxia: Secondary | ICD-10-CM | POA: Diagnosis not present

## 2021-04-15 DIAGNOSIS — R609 Edema, unspecified: Secondary | ICD-10-CM | POA: Diagnosis not present

## 2021-04-15 DIAGNOSIS — E039 Hypothyroidism, unspecified: Secondary | ICD-10-CM | POA: Diagnosis not present

## 2021-04-15 DIAGNOSIS — Z6821 Body mass index (BMI) 21.0-21.9, adult: Secondary | ICD-10-CM | POA: Diagnosis not present

## 2021-04-28 DIAGNOSIS — R609 Edema, unspecified: Secondary | ICD-10-CM | POA: Diagnosis not present

## 2021-04-28 DIAGNOSIS — I10 Essential (primary) hypertension: Secondary | ICD-10-CM | POA: Diagnosis not present

## 2021-04-28 DIAGNOSIS — E039 Hypothyroidism, unspecified: Secondary | ICD-10-CM | POA: Diagnosis not present

## 2021-04-28 DIAGNOSIS — J439 Emphysema, unspecified: Secondary | ICD-10-CM | POA: Diagnosis not present

## 2021-04-28 DIAGNOSIS — B9689 Other specified bacterial agents as the cause of diseases classified elsewhere: Secondary | ICD-10-CM | POA: Diagnosis not present

## 2021-04-28 DIAGNOSIS — I2510 Atherosclerotic heart disease of native coronary artery without angina pectoris: Secondary | ICD-10-CM | POA: Diagnosis not present

## 2021-04-28 DIAGNOSIS — J309 Allergic rhinitis, unspecified: Secondary | ICD-10-CM | POA: Diagnosis not present

## 2021-04-28 DIAGNOSIS — K219 Gastro-esophageal reflux disease without esophagitis: Secondary | ICD-10-CM | POA: Diagnosis not present

## 2021-04-28 DIAGNOSIS — M1990 Unspecified osteoarthritis, unspecified site: Secondary | ICD-10-CM | POA: Diagnosis not present

## 2021-04-28 DIAGNOSIS — Z85118 Personal history of other malignant neoplasm of bronchus and lung: Secondary | ICD-10-CM | POA: Diagnosis not present

## 2021-04-28 DIAGNOSIS — J9611 Chronic respiratory failure with hypoxia: Secondary | ICD-10-CM | POA: Diagnosis not present

## 2021-04-28 DIAGNOSIS — J028 Acute pharyngitis due to other specified organisms: Secondary | ICD-10-CM | POA: Diagnosis not present

## 2021-04-28 DIAGNOSIS — D509 Iron deficiency anemia, unspecified: Secondary | ICD-10-CM | POA: Diagnosis not present

## 2021-04-28 DIAGNOSIS — E785 Hyperlipidemia, unspecified: Secondary | ICD-10-CM | POA: Diagnosis not present

## 2021-05-04 DIAGNOSIS — J449 Chronic obstructive pulmonary disease, unspecified: Secondary | ICD-10-CM | POA: Diagnosis not present

## 2021-05-12 DIAGNOSIS — I2510 Atherosclerotic heart disease of native coronary artery without angina pectoris: Secondary | ICD-10-CM | POA: Diagnosis not present

## 2021-05-12 DIAGNOSIS — Z85118 Personal history of other malignant neoplasm of bronchus and lung: Secondary | ICD-10-CM | POA: Diagnosis not present

## 2021-05-12 DIAGNOSIS — J309 Allergic rhinitis, unspecified: Secondary | ICD-10-CM | POA: Diagnosis not present

## 2021-05-12 DIAGNOSIS — R609 Edema, unspecified: Secondary | ICD-10-CM | POA: Diagnosis not present

## 2021-05-12 DIAGNOSIS — J439 Emphysema, unspecified: Secondary | ICD-10-CM | POA: Diagnosis not present

## 2021-05-12 DIAGNOSIS — J9611 Chronic respiratory failure with hypoxia: Secondary | ICD-10-CM | POA: Diagnosis not present

## 2021-05-12 DIAGNOSIS — K219 Gastro-esophageal reflux disease without esophagitis: Secondary | ICD-10-CM | POA: Diagnosis not present

## 2021-05-12 DIAGNOSIS — R519 Headache, unspecified: Secondary | ICD-10-CM | POA: Diagnosis not present

## 2021-05-12 DIAGNOSIS — E785 Hyperlipidemia, unspecified: Secondary | ICD-10-CM | POA: Diagnosis not present

## 2021-05-12 DIAGNOSIS — E039 Hypothyroidism, unspecified: Secondary | ICD-10-CM | POA: Diagnosis not present

## 2021-05-12 DIAGNOSIS — M1990 Unspecified osteoarthritis, unspecified site: Secondary | ICD-10-CM | POA: Diagnosis not present

## 2021-06-04 DIAGNOSIS — J449 Chronic obstructive pulmonary disease, unspecified: Secondary | ICD-10-CM | POA: Diagnosis not present

## 2021-06-09 DIAGNOSIS — M1990 Unspecified osteoarthritis, unspecified site: Secondary | ICD-10-CM | POA: Diagnosis not present

## 2021-06-09 DIAGNOSIS — Z85118 Personal history of other malignant neoplasm of bronchus and lung: Secondary | ICD-10-CM | POA: Diagnosis not present

## 2021-06-09 DIAGNOSIS — J309 Allergic rhinitis, unspecified: Secondary | ICD-10-CM | POA: Diagnosis not present

## 2021-06-09 DIAGNOSIS — Z6822 Body mass index (BMI) 22.0-22.9, adult: Secondary | ICD-10-CM | POA: Diagnosis not present

## 2021-06-09 DIAGNOSIS — J439 Emphysema, unspecified: Secondary | ICD-10-CM | POA: Diagnosis not present

## 2021-06-09 DIAGNOSIS — I2510 Atherosclerotic heart disease of native coronary artery without angina pectoris: Secondary | ICD-10-CM | POA: Diagnosis not present

## 2021-06-09 DIAGNOSIS — J9611 Chronic respiratory failure with hypoxia: Secondary | ICD-10-CM | POA: Diagnosis not present

## 2021-06-09 DIAGNOSIS — E785 Hyperlipidemia, unspecified: Secondary | ICD-10-CM | POA: Diagnosis not present

## 2021-06-09 DIAGNOSIS — K219 Gastro-esophageal reflux disease without esophagitis: Secondary | ICD-10-CM | POA: Diagnosis not present

## 2021-06-09 DIAGNOSIS — R609 Edema, unspecified: Secondary | ICD-10-CM | POA: Diagnosis not present

## 2021-06-23 DIAGNOSIS — R519 Headache, unspecified: Secondary | ICD-10-CM | POA: Diagnosis not present

## 2021-06-23 DIAGNOSIS — Z85118 Personal history of other malignant neoplasm of bronchus and lung: Secondary | ICD-10-CM | POA: Diagnosis not present

## 2021-06-23 DIAGNOSIS — I2510 Atherosclerotic heart disease of native coronary artery without angina pectoris: Secondary | ICD-10-CM | POA: Diagnosis not present

## 2021-06-23 DIAGNOSIS — J9611 Chronic respiratory failure with hypoxia: Secondary | ICD-10-CM | POA: Diagnosis not present

## 2021-06-23 DIAGNOSIS — E785 Hyperlipidemia, unspecified: Secondary | ICD-10-CM | POA: Diagnosis not present

## 2021-06-23 DIAGNOSIS — K219 Gastro-esophageal reflux disease without esophagitis: Secondary | ICD-10-CM | POA: Diagnosis not present

## 2021-06-23 DIAGNOSIS — R5382 Chronic fatigue, unspecified: Secondary | ICD-10-CM | POA: Diagnosis not present

## 2021-06-23 DIAGNOSIS — M1990 Unspecified osteoarthritis, unspecified site: Secondary | ICD-10-CM | POA: Diagnosis not present

## 2021-06-23 DIAGNOSIS — J309 Allergic rhinitis, unspecified: Secondary | ICD-10-CM | POA: Diagnosis not present

## 2021-06-23 DIAGNOSIS — J439 Emphysema, unspecified: Secondary | ICD-10-CM | POA: Diagnosis not present

## 2021-06-23 DIAGNOSIS — R609 Edema, unspecified: Secondary | ICD-10-CM | POA: Diagnosis not present

## 2021-07-04 DIAGNOSIS — J449 Chronic obstructive pulmonary disease, unspecified: Secondary | ICD-10-CM | POA: Diagnosis not present

## 2021-07-07 DIAGNOSIS — R609 Edema, unspecified: Secondary | ICD-10-CM | POA: Diagnosis not present

## 2021-07-07 DIAGNOSIS — K219 Gastro-esophageal reflux disease without esophagitis: Secondary | ICD-10-CM | POA: Diagnosis not present

## 2021-07-07 DIAGNOSIS — R7989 Other specified abnormal findings of blood chemistry: Secondary | ICD-10-CM | POA: Diagnosis not present

## 2021-07-07 DIAGNOSIS — M1990 Unspecified osteoarthritis, unspecified site: Secondary | ICD-10-CM | POA: Diagnosis not present

## 2021-07-07 DIAGNOSIS — J439 Emphysema, unspecified: Secondary | ICD-10-CM | POA: Diagnosis not present

## 2021-07-07 DIAGNOSIS — I2510 Atherosclerotic heart disease of native coronary artery without angina pectoris: Secondary | ICD-10-CM | POA: Diagnosis not present

## 2021-07-07 DIAGNOSIS — E785 Hyperlipidemia, unspecified: Secondary | ICD-10-CM | POA: Diagnosis not present

## 2021-07-07 DIAGNOSIS — J309 Allergic rhinitis, unspecified: Secondary | ICD-10-CM | POA: Diagnosis not present

## 2021-07-07 DIAGNOSIS — J9611 Chronic respiratory failure with hypoxia: Secondary | ICD-10-CM | POA: Diagnosis not present

## 2021-07-07 DIAGNOSIS — Z85118 Personal history of other malignant neoplasm of bronchus and lung: Secondary | ICD-10-CM | POA: Diagnosis not present

## 2021-07-07 DIAGNOSIS — R519 Headache, unspecified: Secondary | ICD-10-CM | POA: Diagnosis not present

## 2021-07-09 DIAGNOSIS — Z0000 Encounter for general adult medical examination without abnormal findings: Secondary | ICD-10-CM | POA: Diagnosis not present

## 2021-07-09 DIAGNOSIS — Z9181 History of falling: Secondary | ICD-10-CM | POA: Diagnosis not present

## 2021-07-09 DIAGNOSIS — E785 Hyperlipidemia, unspecified: Secondary | ICD-10-CM | POA: Diagnosis not present

## 2021-07-30 DIAGNOSIS — R7989 Other specified abnormal findings of blood chemistry: Secondary | ICD-10-CM | POA: Diagnosis not present

## 2021-07-30 DIAGNOSIS — J309 Allergic rhinitis, unspecified: Secondary | ICD-10-CM | POA: Diagnosis not present

## 2021-07-30 DIAGNOSIS — R519 Headache, unspecified: Secondary | ICD-10-CM | POA: Diagnosis not present

## 2021-07-30 DIAGNOSIS — J9611 Chronic respiratory failure with hypoxia: Secondary | ICD-10-CM | POA: Diagnosis not present

## 2021-07-30 DIAGNOSIS — K219 Gastro-esophageal reflux disease without esophagitis: Secondary | ICD-10-CM | POA: Diagnosis not present

## 2021-07-30 DIAGNOSIS — I10 Essential (primary) hypertension: Secondary | ICD-10-CM | POA: Diagnosis not present

## 2021-07-30 DIAGNOSIS — R609 Edema, unspecified: Secondary | ICD-10-CM | POA: Diagnosis not present

## 2021-07-30 DIAGNOSIS — Z85118 Personal history of other malignant neoplasm of bronchus and lung: Secondary | ICD-10-CM | POA: Diagnosis not present

## 2021-07-30 DIAGNOSIS — D509 Iron deficiency anemia, unspecified: Secondary | ICD-10-CM | POA: Diagnosis not present

## 2021-07-30 DIAGNOSIS — J439 Emphysema, unspecified: Secondary | ICD-10-CM | POA: Diagnosis not present

## 2021-07-30 DIAGNOSIS — M1990 Unspecified osteoarthritis, unspecified site: Secondary | ICD-10-CM | POA: Diagnosis not present

## 2021-07-30 DIAGNOSIS — E785 Hyperlipidemia, unspecified: Secondary | ICD-10-CM | POA: Diagnosis not present

## 2021-07-30 DIAGNOSIS — I2510 Atherosclerotic heart disease of native coronary artery without angina pectoris: Secondary | ICD-10-CM | POA: Diagnosis not present

## 2021-08-04 DIAGNOSIS — J449 Chronic obstructive pulmonary disease, unspecified: Secondary | ICD-10-CM | POA: Diagnosis not present

## 2021-08-21 DIAGNOSIS — Z880 Allergy status to penicillin: Secondary | ICD-10-CM | POA: Diagnosis not present

## 2021-08-21 DIAGNOSIS — R079 Chest pain, unspecified: Secondary | ICD-10-CM | POA: Diagnosis not present

## 2021-08-21 DIAGNOSIS — Z7982 Long term (current) use of aspirin: Secondary | ICD-10-CM | POA: Diagnosis not present

## 2021-08-21 DIAGNOSIS — Z79899 Other long term (current) drug therapy: Secondary | ICD-10-CM | POA: Diagnosis not present

## 2021-08-21 DIAGNOSIS — R062 Wheezing: Secondary | ICD-10-CM | POA: Diagnosis not present

## 2021-08-21 DIAGNOSIS — M549 Dorsalgia, unspecified: Secondary | ICD-10-CM | POA: Diagnosis not present

## 2021-08-21 DIAGNOSIS — J441 Chronic obstructive pulmonary disease with (acute) exacerbation: Secondary | ICD-10-CM | POA: Diagnosis not present

## 2021-08-21 DIAGNOSIS — Z6823 Body mass index (BMI) 23.0-23.9, adult: Secondary | ICD-10-CM | POA: Diagnosis not present

## 2021-08-21 DIAGNOSIS — R0602 Shortness of breath: Secondary | ICD-10-CM | POA: Diagnosis not present

## 2021-08-21 DIAGNOSIS — F17210 Nicotine dependence, cigarettes, uncomplicated: Secondary | ICD-10-CM | POA: Diagnosis not present

## 2021-08-21 DIAGNOSIS — M25511 Pain in right shoulder: Secondary | ICD-10-CM | POA: Diagnosis not present

## 2021-08-21 DIAGNOSIS — R0789 Other chest pain: Secondary | ICD-10-CM | POA: Diagnosis not present

## 2021-08-21 DIAGNOSIS — Z955 Presence of coronary angioplasty implant and graft: Secondary | ICD-10-CM | POA: Diagnosis not present

## 2021-08-21 DIAGNOSIS — I2510 Atherosclerotic heart disease of native coronary artery without angina pectoris: Secondary | ICD-10-CM | POA: Diagnosis not present

## 2021-08-21 DIAGNOSIS — R0600 Dyspnea, unspecified: Secondary | ICD-10-CM | POA: Diagnosis not present

## 2021-08-25 DIAGNOSIS — J441 Chronic obstructive pulmonary disease with (acute) exacerbation: Secondary | ICD-10-CM | POA: Diagnosis not present

## 2021-08-27 DIAGNOSIS — J9611 Chronic respiratory failure with hypoxia: Secondary | ICD-10-CM | POA: Diagnosis not present

## 2021-08-27 DIAGNOSIS — Z85118 Personal history of other malignant neoplasm of bronchus and lung: Secondary | ICD-10-CM | POA: Diagnosis not present

## 2021-08-27 DIAGNOSIS — R7989 Other specified abnormal findings of blood chemistry: Secondary | ICD-10-CM | POA: Diagnosis not present

## 2021-08-27 DIAGNOSIS — I2510 Atherosclerotic heart disease of native coronary artery without angina pectoris: Secondary | ICD-10-CM | POA: Diagnosis not present

## 2021-08-27 DIAGNOSIS — E785 Hyperlipidemia, unspecified: Secondary | ICD-10-CM | POA: Diagnosis not present

## 2021-08-27 DIAGNOSIS — R519 Headache, unspecified: Secondary | ICD-10-CM | POA: Diagnosis not present

## 2021-08-27 DIAGNOSIS — J439 Emphysema, unspecified: Secondary | ICD-10-CM | POA: Diagnosis not present

## 2021-08-27 DIAGNOSIS — R609 Edema, unspecified: Secondary | ICD-10-CM | POA: Diagnosis not present

## 2021-08-27 DIAGNOSIS — K219 Gastro-esophageal reflux disease without esophagitis: Secondary | ICD-10-CM | POA: Diagnosis not present

## 2021-08-27 DIAGNOSIS — J309 Allergic rhinitis, unspecified: Secondary | ICD-10-CM | POA: Diagnosis not present

## 2021-08-27 DIAGNOSIS — M1990 Unspecified osteoarthritis, unspecified site: Secondary | ICD-10-CM | POA: Diagnosis not present

## 2021-09-03 DIAGNOSIS — J449 Chronic obstructive pulmonary disease, unspecified: Secondary | ICD-10-CM | POA: Diagnosis not present

## 2021-09-24 DIAGNOSIS — G8929 Other chronic pain: Secondary | ICD-10-CM | POA: Diagnosis not present

## 2021-09-24 DIAGNOSIS — J9611 Chronic respiratory failure with hypoxia: Secondary | ICD-10-CM | POA: Diagnosis not present

## 2021-09-24 DIAGNOSIS — M549 Dorsalgia, unspecified: Secondary | ICD-10-CM | POA: Diagnosis not present

## 2021-09-24 DIAGNOSIS — K219 Gastro-esophageal reflux disease without esophagitis: Secondary | ICD-10-CM | POA: Diagnosis not present

## 2021-09-24 DIAGNOSIS — J439 Emphysema, unspecified: Secondary | ICD-10-CM | POA: Diagnosis not present

## 2021-09-24 DIAGNOSIS — M1990 Unspecified osteoarthritis, unspecified site: Secondary | ICD-10-CM | POA: Diagnosis not present

## 2021-09-24 DIAGNOSIS — Z6822 Body mass index (BMI) 22.0-22.9, adult: Secondary | ICD-10-CM | POA: Diagnosis not present

## 2021-09-24 DIAGNOSIS — Z85118 Personal history of other malignant neoplasm of bronchus and lung: Secondary | ICD-10-CM | POA: Diagnosis not present

## 2021-09-24 DIAGNOSIS — M25552 Pain in left hip: Secondary | ICD-10-CM | POA: Diagnosis not present

## 2021-09-24 DIAGNOSIS — R609 Edema, unspecified: Secondary | ICD-10-CM | POA: Diagnosis not present

## 2021-09-24 DIAGNOSIS — R7989 Other specified abnormal findings of blood chemistry: Secondary | ICD-10-CM | POA: Diagnosis not present

## 2021-09-30 DIAGNOSIS — Z87891 Personal history of nicotine dependence: Secondary | ICD-10-CM | POA: Diagnosis not present

## 2021-09-30 DIAGNOSIS — Z122 Encounter for screening for malignant neoplasm of respiratory organs: Secondary | ICD-10-CM | POA: Diagnosis not present

## 2021-09-30 DIAGNOSIS — F17210 Nicotine dependence, cigarettes, uncomplicated: Secondary | ICD-10-CM | POA: Diagnosis not present

## 2021-10-04 DIAGNOSIS — J449 Chronic obstructive pulmonary disease, unspecified: Secondary | ICD-10-CM | POA: Diagnosis not present

## 2021-10-09 DIAGNOSIS — I2510 Atherosclerotic heart disease of native coronary artery without angina pectoris: Secondary | ICD-10-CM | POA: Diagnosis not present

## 2021-10-09 DIAGNOSIS — F17210 Nicotine dependence, cigarettes, uncomplicated: Secondary | ICD-10-CM | POA: Diagnosis not present

## 2021-10-09 DIAGNOSIS — J449 Chronic obstructive pulmonary disease, unspecified: Secondary | ICD-10-CM | POA: Diagnosis not present

## 2021-10-09 DIAGNOSIS — J301 Allergic rhinitis due to pollen: Secondary | ICD-10-CM | POA: Diagnosis not present

## 2021-10-23 DIAGNOSIS — E039 Hypothyroidism, unspecified: Secondary | ICD-10-CM | POA: Diagnosis not present

## 2021-10-23 DIAGNOSIS — M25552 Pain in left hip: Secondary | ICD-10-CM | POA: Diagnosis not present

## 2021-10-23 DIAGNOSIS — G8929 Other chronic pain: Secondary | ICD-10-CM | POA: Diagnosis not present

## 2021-10-23 DIAGNOSIS — I10 Essential (primary) hypertension: Secondary | ICD-10-CM | POA: Diagnosis not present

## 2021-10-23 DIAGNOSIS — D509 Iron deficiency anemia, unspecified: Secondary | ICD-10-CM | POA: Diagnosis not present

## 2021-10-23 DIAGNOSIS — M549 Dorsalgia, unspecified: Secondary | ICD-10-CM | POA: Diagnosis not present

## 2021-10-23 DIAGNOSIS — Z85118 Personal history of other malignant neoplasm of bronchus and lung: Secondary | ICD-10-CM | POA: Diagnosis not present

## 2021-10-23 DIAGNOSIS — R600 Localized edema: Secondary | ICD-10-CM | POA: Diagnosis not present

## 2021-10-23 DIAGNOSIS — E2740 Unspecified adrenocortical insufficiency: Secondary | ICD-10-CM | POA: Diagnosis not present

## 2021-10-23 DIAGNOSIS — J449 Chronic obstructive pulmonary disease, unspecified: Secondary | ICD-10-CM | POA: Diagnosis not present

## 2021-10-23 DIAGNOSIS — E785 Hyperlipidemia, unspecified: Secondary | ICD-10-CM | POA: Diagnosis not present

## 2021-10-23 DIAGNOSIS — K219 Gastro-esophageal reflux disease without esophagitis: Secondary | ICD-10-CM | POA: Diagnosis not present

## 2021-10-23 DIAGNOSIS — M159 Polyosteoarthritis, unspecified: Secondary | ICD-10-CM | POA: Diagnosis not present

## 2021-10-23 DIAGNOSIS — R5382 Chronic fatigue, unspecified: Secondary | ICD-10-CM | POA: Diagnosis not present

## 2021-10-23 DIAGNOSIS — J9611 Chronic respiratory failure with hypoxia: Secondary | ICD-10-CM | POA: Diagnosis not present

## 2021-10-30 DIAGNOSIS — G8929 Other chronic pain: Secondary | ICD-10-CM | POA: Diagnosis not present

## 2021-10-30 DIAGNOSIS — Z85118 Personal history of other malignant neoplasm of bronchus and lung: Secondary | ICD-10-CM | POA: Diagnosis not present

## 2021-10-30 DIAGNOSIS — M549 Dorsalgia, unspecified: Secondary | ICD-10-CM | POA: Diagnosis not present

## 2021-10-30 DIAGNOSIS — M159 Polyosteoarthritis, unspecified: Secondary | ICD-10-CM | POA: Diagnosis not present

## 2021-10-30 DIAGNOSIS — J309 Allergic rhinitis, unspecified: Secondary | ICD-10-CM | POA: Diagnosis not present

## 2021-10-30 DIAGNOSIS — J449 Chronic obstructive pulmonary disease, unspecified: Secondary | ICD-10-CM | POA: Diagnosis not present

## 2021-10-30 DIAGNOSIS — K219 Gastro-esophageal reflux disease without esophagitis: Secondary | ICD-10-CM | POA: Diagnosis not present

## 2021-10-30 DIAGNOSIS — E2740 Unspecified adrenocortical insufficiency: Secondary | ICD-10-CM | POA: Diagnosis not present

## 2021-10-30 DIAGNOSIS — M25552 Pain in left hip: Secondary | ICD-10-CM | POA: Diagnosis not present

## 2021-10-30 DIAGNOSIS — R600 Localized edema: Secondary | ICD-10-CM | POA: Diagnosis not present

## 2021-10-30 DIAGNOSIS — J9611 Chronic respiratory failure with hypoxia: Secondary | ICD-10-CM | POA: Diagnosis not present

## 2021-11-04 DIAGNOSIS — J449 Chronic obstructive pulmonary disease, unspecified: Secondary | ICD-10-CM | POA: Diagnosis not present

## 2021-11-06 DIAGNOSIS — R600 Localized edema: Secondary | ICD-10-CM | POA: Diagnosis not present

## 2021-11-06 DIAGNOSIS — J449 Chronic obstructive pulmonary disease, unspecified: Secondary | ICD-10-CM | POA: Diagnosis not present

## 2021-11-06 DIAGNOSIS — M549 Dorsalgia, unspecified: Secondary | ICD-10-CM | POA: Diagnosis not present

## 2021-11-06 DIAGNOSIS — R110 Nausea: Secondary | ICD-10-CM | POA: Diagnosis not present

## 2021-11-06 DIAGNOSIS — J9611 Chronic respiratory failure with hypoxia: Secondary | ICD-10-CM | POA: Diagnosis not present

## 2021-11-06 DIAGNOSIS — M159 Polyosteoarthritis, unspecified: Secondary | ICD-10-CM | POA: Diagnosis not present

## 2021-11-06 DIAGNOSIS — J309 Allergic rhinitis, unspecified: Secondary | ICD-10-CM | POA: Diagnosis not present

## 2021-11-06 DIAGNOSIS — K219 Gastro-esophageal reflux disease without esophagitis: Secondary | ICD-10-CM | POA: Diagnosis not present

## 2021-11-06 DIAGNOSIS — Z85118 Personal history of other malignant neoplasm of bronchus and lung: Secondary | ICD-10-CM | POA: Diagnosis not present

## 2021-11-06 DIAGNOSIS — E2740 Unspecified adrenocortical insufficiency: Secondary | ICD-10-CM | POA: Diagnosis not present

## 2021-11-06 DIAGNOSIS — G8929 Other chronic pain: Secondary | ICD-10-CM | POA: Diagnosis not present

## 2021-11-14 DIAGNOSIS — I2510 Atherosclerotic heart disease of native coronary artery without angina pectoris: Secondary | ICD-10-CM | POA: Diagnosis not present

## 2021-11-18 DIAGNOSIS — J449 Chronic obstructive pulmonary disease, unspecified: Secondary | ICD-10-CM | POA: Diagnosis not present

## 2021-11-18 DIAGNOSIS — F17210 Nicotine dependence, cigarettes, uncomplicated: Secondary | ICD-10-CM | POA: Diagnosis not present

## 2021-11-18 DIAGNOSIS — I2510 Atherosclerotic heart disease of native coronary artery without angina pectoris: Secondary | ICD-10-CM | POA: Diagnosis not present

## 2021-11-18 DIAGNOSIS — J301 Allergic rhinitis due to pollen: Secondary | ICD-10-CM | POA: Diagnosis not present

## 2021-11-20 DIAGNOSIS — E2740 Unspecified adrenocortical insufficiency: Secondary | ICD-10-CM | POA: Diagnosis not present

## 2021-11-20 DIAGNOSIS — J9611 Chronic respiratory failure with hypoxia: Secondary | ICD-10-CM | POA: Diagnosis not present

## 2021-11-20 DIAGNOSIS — Z85118 Personal history of other malignant neoplasm of bronchus and lung: Secondary | ICD-10-CM | POA: Diagnosis not present

## 2021-11-20 DIAGNOSIS — R600 Localized edema: Secondary | ICD-10-CM | POA: Diagnosis not present

## 2021-11-20 DIAGNOSIS — R110 Nausea: Secondary | ICD-10-CM | POA: Diagnosis not present

## 2021-11-20 DIAGNOSIS — M549 Dorsalgia, unspecified: Secondary | ICD-10-CM | POA: Diagnosis not present

## 2021-11-20 DIAGNOSIS — G8929 Other chronic pain: Secondary | ICD-10-CM | POA: Diagnosis not present

## 2021-11-20 DIAGNOSIS — K219 Gastro-esophageal reflux disease without esophagitis: Secondary | ICD-10-CM | POA: Diagnosis not present

## 2021-11-20 DIAGNOSIS — J449 Chronic obstructive pulmonary disease, unspecified: Secondary | ICD-10-CM | POA: Diagnosis not present

## 2021-11-20 DIAGNOSIS — J309 Allergic rhinitis, unspecified: Secondary | ICD-10-CM | POA: Diagnosis not present

## 2021-11-20 DIAGNOSIS — M159 Polyosteoarthritis, unspecified: Secondary | ICD-10-CM | POA: Diagnosis not present

## 2021-12-23 ENCOUNTER — Other Ambulatory Visit: Payer: Self-pay | Admitting: Internal Medicine

## 2021-12-23 DIAGNOSIS — Z1231 Encounter for screening mammogram for malignant neoplasm of breast: Secondary | ICD-10-CM

## 2022-08-05 DIAGNOSIS — J449 Chronic obstructive pulmonary disease, unspecified: Secondary | ICD-10-CM | POA: Diagnosis not present

## 2022-08-13 DIAGNOSIS — Z85118 Personal history of other malignant neoplasm of bronchus and lung: Secondary | ICD-10-CM | POA: Diagnosis not present

## 2022-08-13 DIAGNOSIS — J9611 Chronic respiratory failure with hypoxia: Secondary | ICD-10-CM | POA: Diagnosis not present

## 2022-08-13 DIAGNOSIS — M159 Polyosteoarthritis, unspecified: Secondary | ICD-10-CM | POA: Diagnosis not present

## 2022-08-13 DIAGNOSIS — G8929 Other chronic pain: Secondary | ICD-10-CM | POA: Diagnosis not present

## 2022-08-13 DIAGNOSIS — J449 Chronic obstructive pulmonary disease, unspecified: Secondary | ICD-10-CM | POA: Diagnosis not present

## 2022-08-13 DIAGNOSIS — R600 Localized edema: Secondary | ICD-10-CM | POA: Diagnosis not present

## 2022-08-13 DIAGNOSIS — R110 Nausea: Secondary | ICD-10-CM | POA: Diagnosis not present

## 2022-08-13 DIAGNOSIS — M549 Dorsalgia, unspecified: Secondary | ICD-10-CM | POA: Diagnosis not present

## 2022-08-13 DIAGNOSIS — K219 Gastro-esophageal reflux disease without esophagitis: Secondary | ICD-10-CM | POA: Diagnosis not present

## 2022-08-13 DIAGNOSIS — I10 Essential (primary) hypertension: Secondary | ICD-10-CM | POA: Diagnosis not present

## 2022-08-13 DIAGNOSIS — E2740 Unspecified adrenocortical insufficiency: Secondary | ICD-10-CM | POA: Diagnosis not present

## 2022-08-27 DIAGNOSIS — J449 Chronic obstructive pulmonary disease, unspecified: Secondary | ICD-10-CM | POA: Diagnosis not present

## 2022-08-27 DIAGNOSIS — M549 Dorsalgia, unspecified: Secondary | ICD-10-CM | POA: Diagnosis not present

## 2022-08-27 DIAGNOSIS — J069 Acute upper respiratory infection, unspecified: Secondary | ICD-10-CM | POA: Diagnosis not present

## 2022-08-27 DIAGNOSIS — G8929 Other chronic pain: Secondary | ICD-10-CM | POA: Diagnosis not present

## 2022-08-27 DIAGNOSIS — R110 Nausea: Secondary | ICD-10-CM | POA: Diagnosis not present

## 2022-08-27 DIAGNOSIS — E785 Hyperlipidemia, unspecified: Secondary | ICD-10-CM | POA: Diagnosis not present

## 2022-08-27 DIAGNOSIS — M159 Polyosteoarthritis, unspecified: Secondary | ICD-10-CM | POA: Diagnosis not present

## 2022-08-27 DIAGNOSIS — I10 Essential (primary) hypertension: Secondary | ICD-10-CM | POA: Diagnosis not present

## 2022-08-27 DIAGNOSIS — D509 Iron deficiency anemia, unspecified: Secondary | ICD-10-CM | POA: Diagnosis not present

## 2022-08-27 DIAGNOSIS — J9611 Chronic respiratory failure with hypoxia: Secondary | ICD-10-CM | POA: Diagnosis not present

## 2022-08-27 DIAGNOSIS — E2740 Unspecified adrenocortical insufficiency: Secondary | ICD-10-CM | POA: Diagnosis not present

## 2022-09-04 ENCOUNTER — Encounter: Payer: Self-pay | Admitting: Gastroenterology

## 2022-09-04 DIAGNOSIS — J449 Chronic obstructive pulmonary disease, unspecified: Secondary | ICD-10-CM | POA: Diagnosis not present

## 2022-09-10 DIAGNOSIS — M549 Dorsalgia, unspecified: Secondary | ICD-10-CM | POA: Diagnosis not present

## 2022-09-10 DIAGNOSIS — G8929 Other chronic pain: Secondary | ICD-10-CM | POA: Diagnosis not present

## 2022-09-10 DIAGNOSIS — I10 Essential (primary) hypertension: Secondary | ICD-10-CM | POA: Diagnosis not present

## 2022-09-10 DIAGNOSIS — K219 Gastro-esophageal reflux disease without esophagitis: Secondary | ICD-10-CM | POA: Diagnosis not present

## 2022-09-10 DIAGNOSIS — R600 Localized edema: Secondary | ICD-10-CM | POA: Diagnosis not present

## 2022-09-10 DIAGNOSIS — J449 Chronic obstructive pulmonary disease, unspecified: Secondary | ICD-10-CM | POA: Diagnosis not present

## 2022-09-10 DIAGNOSIS — M159 Polyosteoarthritis, unspecified: Secondary | ICD-10-CM | POA: Diagnosis not present

## 2022-09-10 DIAGNOSIS — J9611 Chronic respiratory failure with hypoxia: Secondary | ICD-10-CM | POA: Diagnosis not present

## 2022-09-10 DIAGNOSIS — R110 Nausea: Secondary | ICD-10-CM | POA: Diagnosis not present

## 2022-09-10 DIAGNOSIS — E2740 Unspecified adrenocortical insufficiency: Secondary | ICD-10-CM | POA: Diagnosis not present

## 2022-09-10 DIAGNOSIS — Z85118 Personal history of other malignant neoplasm of bronchus and lung: Secondary | ICD-10-CM | POA: Diagnosis not present

## 2022-10-05 DIAGNOSIS — J449 Chronic obstructive pulmonary disease, unspecified: Secondary | ICD-10-CM | POA: Diagnosis not present

## 2022-10-08 DIAGNOSIS — R110 Nausea: Secondary | ICD-10-CM | POA: Diagnosis not present

## 2022-10-08 DIAGNOSIS — M549 Dorsalgia, unspecified: Secondary | ICD-10-CM | POA: Diagnosis not present

## 2022-10-08 DIAGNOSIS — J449 Chronic obstructive pulmonary disease, unspecified: Secondary | ICD-10-CM | POA: Diagnosis not present

## 2022-10-08 DIAGNOSIS — E2740 Unspecified adrenocortical insufficiency: Secondary | ICD-10-CM | POA: Diagnosis not present

## 2022-10-08 DIAGNOSIS — K219 Gastro-esophageal reflux disease without esophagitis: Secondary | ICD-10-CM | POA: Diagnosis not present

## 2022-10-08 DIAGNOSIS — I10 Essential (primary) hypertension: Secondary | ICD-10-CM | POA: Diagnosis not present

## 2022-10-08 DIAGNOSIS — M159 Polyosteoarthritis, unspecified: Secondary | ICD-10-CM | POA: Diagnosis not present

## 2022-10-08 DIAGNOSIS — G8929 Other chronic pain: Secondary | ICD-10-CM | POA: Diagnosis not present

## 2022-10-08 DIAGNOSIS — R600 Localized edema: Secondary | ICD-10-CM | POA: Diagnosis not present

## 2022-10-08 DIAGNOSIS — J9611 Chronic respiratory failure with hypoxia: Secondary | ICD-10-CM | POA: Diagnosis not present

## 2022-11-05 DIAGNOSIS — J449 Chronic obstructive pulmonary disease, unspecified: Secondary | ICD-10-CM | POA: Diagnosis not present

## 2022-11-05 DIAGNOSIS — I10 Essential (primary) hypertension: Secondary | ICD-10-CM | POA: Diagnosis not present

## 2022-11-05 DIAGNOSIS — R600 Localized edema: Secondary | ICD-10-CM | POA: Diagnosis not present

## 2022-11-05 DIAGNOSIS — G8929 Other chronic pain: Secondary | ICD-10-CM | POA: Diagnosis not present

## 2022-11-05 DIAGNOSIS — J9611 Chronic respiratory failure with hypoxia: Secondary | ICD-10-CM | POA: Diagnosis not present

## 2022-11-05 DIAGNOSIS — I2089 Other forms of angina pectoris: Secondary | ICD-10-CM | POA: Diagnosis not present

## 2022-11-05 DIAGNOSIS — E2740 Unspecified adrenocortical insufficiency: Secondary | ICD-10-CM | POA: Diagnosis not present

## 2022-11-05 DIAGNOSIS — M159 Polyosteoarthritis, unspecified: Secondary | ICD-10-CM | POA: Diagnosis not present

## 2022-11-05 DIAGNOSIS — R110 Nausea: Secondary | ICD-10-CM | POA: Diagnosis not present

## 2022-11-05 DIAGNOSIS — M549 Dorsalgia, unspecified: Secondary | ICD-10-CM | POA: Diagnosis not present

## 2022-11-06 DIAGNOSIS — Z122 Encounter for screening for malignant neoplasm of respiratory organs: Secondary | ICD-10-CM | POA: Diagnosis not present

## 2022-11-06 DIAGNOSIS — I700 Atherosclerosis of aorta: Secondary | ICD-10-CM | POA: Diagnosis not present

## 2022-11-06 DIAGNOSIS — J439 Emphysema, unspecified: Secondary | ICD-10-CM | POA: Diagnosis not present

## 2022-11-06 DIAGNOSIS — F17210 Nicotine dependence, cigarettes, uncomplicated: Secondary | ICD-10-CM | POA: Diagnosis not present

## 2022-11-11 DIAGNOSIS — I2510 Atherosclerotic heart disease of native coronary artery without angina pectoris: Secondary | ICD-10-CM | POA: Diagnosis not present

## 2022-11-11 DIAGNOSIS — J449 Chronic obstructive pulmonary disease, unspecified: Secondary | ICD-10-CM | POA: Diagnosis not present

## 2022-11-11 DIAGNOSIS — J301 Allergic rhinitis due to pollen: Secondary | ICD-10-CM | POA: Diagnosis not present

## 2022-11-23 DIAGNOSIS — J0110 Acute frontal sinusitis, unspecified: Secondary | ICD-10-CM | POA: Diagnosis not present

## 2022-11-23 DIAGNOSIS — Z880 Allergy status to penicillin: Secondary | ICD-10-CM | POA: Diagnosis not present

## 2022-11-23 DIAGNOSIS — F17210 Nicotine dependence, cigarettes, uncomplicated: Secondary | ICD-10-CM | POA: Diagnosis not present

## 2022-11-23 DIAGNOSIS — R5383 Other fatigue: Secondary | ICD-10-CM | POA: Diagnosis not present

## 2022-11-23 DIAGNOSIS — R079 Chest pain, unspecified: Secondary | ICD-10-CM | POA: Diagnosis not present

## 2022-11-23 DIAGNOSIS — Z79899 Other long term (current) drug therapy: Secondary | ICD-10-CM | POA: Diagnosis not present

## 2022-11-23 DIAGNOSIS — J441 Chronic obstructive pulmonary disease with (acute) exacerbation: Secondary | ICD-10-CM | POA: Diagnosis not present

## 2022-11-23 DIAGNOSIS — Z7982 Long term (current) use of aspirin: Secondary | ICD-10-CM | POA: Diagnosis not present

## 2022-11-23 DIAGNOSIS — I2510 Atherosclerotic heart disease of native coronary artery without angina pectoris: Secondary | ICD-10-CM | POA: Diagnosis not present

## 2022-11-23 DIAGNOSIS — D509 Iron deficiency anemia, unspecified: Secondary | ICD-10-CM | POA: Diagnosis not present

## 2022-11-23 DIAGNOSIS — J439 Emphysema, unspecified: Secondary | ICD-10-CM | POA: Diagnosis not present

## 2022-11-23 DIAGNOSIS — I252 Old myocardial infarction: Secondary | ICD-10-CM | POA: Diagnosis not present

## 2022-11-23 DIAGNOSIS — Z9981 Dependence on supplemental oxygen: Secondary | ICD-10-CM | POA: Diagnosis not present

## 2022-11-23 DIAGNOSIS — R5381 Other malaise: Secondary | ICD-10-CM | POA: Diagnosis not present

## 2022-11-23 DIAGNOSIS — R0602 Shortness of breath: Secondary | ICD-10-CM | POA: Diagnosis not present

## 2022-11-23 DIAGNOSIS — R0989 Other specified symptoms and signs involving the circulatory and respiratory systems: Secondary | ICD-10-CM | POA: Diagnosis not present

## 2022-11-23 DIAGNOSIS — Z885 Allergy status to narcotic agent status: Secondary | ICD-10-CM | POA: Diagnosis not present

## 2022-11-30 DIAGNOSIS — H25813 Combined forms of age-related cataract, bilateral: Secondary | ICD-10-CM | POA: Diagnosis not present

## 2022-12-03 DIAGNOSIS — J449 Chronic obstructive pulmonary disease, unspecified: Secondary | ICD-10-CM | POA: Diagnosis not present

## 2022-12-03 DIAGNOSIS — M549 Dorsalgia, unspecified: Secondary | ICD-10-CM | POA: Diagnosis not present

## 2022-12-03 DIAGNOSIS — E2740 Unspecified adrenocortical insufficiency: Secondary | ICD-10-CM | POA: Diagnosis not present

## 2022-12-03 DIAGNOSIS — I10 Essential (primary) hypertension: Secondary | ICD-10-CM | POA: Diagnosis not present

## 2022-12-03 DIAGNOSIS — M159 Polyosteoarthritis, unspecified: Secondary | ICD-10-CM | POA: Diagnosis not present

## 2022-12-03 DIAGNOSIS — I2089 Other forms of angina pectoris: Secondary | ICD-10-CM | POA: Diagnosis not present

## 2022-12-03 DIAGNOSIS — R110 Nausea: Secondary | ICD-10-CM | POA: Diagnosis not present

## 2022-12-03 DIAGNOSIS — G8929 Other chronic pain: Secondary | ICD-10-CM | POA: Diagnosis not present

## 2022-12-03 DIAGNOSIS — R600 Localized edema: Secondary | ICD-10-CM | POA: Diagnosis not present

## 2022-12-03 DIAGNOSIS — J9611 Chronic respiratory failure with hypoxia: Secondary | ICD-10-CM | POA: Diagnosis not present

## 2022-12-10 DIAGNOSIS — R5383 Other fatigue: Secondary | ICD-10-CM | POA: Diagnosis not present

## 2022-12-10 DIAGNOSIS — R5381 Other malaise: Secondary | ICD-10-CM | POA: Diagnosis not present

## 2022-12-17 DIAGNOSIS — R600 Localized edema: Secondary | ICD-10-CM | POA: Diagnosis not present

## 2022-12-17 DIAGNOSIS — R110 Nausea: Secondary | ICD-10-CM | POA: Diagnosis not present

## 2022-12-17 DIAGNOSIS — E785 Hyperlipidemia, unspecified: Secondary | ICD-10-CM | POA: Diagnosis not present

## 2022-12-17 DIAGNOSIS — F17210 Nicotine dependence, cigarettes, uncomplicated: Secondary | ICD-10-CM | POA: Diagnosis not present

## 2022-12-17 DIAGNOSIS — J9611 Chronic respiratory failure with hypoxia: Secondary | ICD-10-CM | POA: Diagnosis not present

## 2022-12-17 DIAGNOSIS — D509 Iron deficiency anemia, unspecified: Secondary | ICD-10-CM | POA: Diagnosis not present

## 2022-12-17 DIAGNOSIS — I10 Essential (primary) hypertension: Secondary | ICD-10-CM | POA: Diagnosis not present

## 2022-12-17 DIAGNOSIS — M159 Polyosteoarthritis, unspecified: Secondary | ICD-10-CM | POA: Diagnosis not present

## 2022-12-17 DIAGNOSIS — I2510 Atherosclerotic heart disease of native coronary artery without angina pectoris: Secondary | ICD-10-CM | POA: Diagnosis not present

## 2022-12-17 DIAGNOSIS — J069 Acute upper respiratory infection, unspecified: Secondary | ICD-10-CM | POA: Diagnosis not present

## 2022-12-17 DIAGNOSIS — J301 Allergic rhinitis due to pollen: Secondary | ICD-10-CM | POA: Diagnosis not present

## 2022-12-17 DIAGNOSIS — G8929 Other chronic pain: Secondary | ICD-10-CM | POA: Diagnosis not present

## 2022-12-17 DIAGNOSIS — E039 Hypothyroidism, unspecified: Secondary | ICD-10-CM | POA: Diagnosis not present

## 2022-12-17 DIAGNOSIS — E2740 Unspecified adrenocortical insufficiency: Secondary | ICD-10-CM | POA: Diagnosis not present

## 2022-12-17 DIAGNOSIS — J449 Chronic obstructive pulmonary disease, unspecified: Secondary | ICD-10-CM | POA: Diagnosis not present

## 2022-12-17 DIAGNOSIS — M549 Dorsalgia, unspecified: Secondary | ICD-10-CM | POA: Diagnosis not present

## 2022-12-31 DIAGNOSIS — M549 Dorsalgia, unspecified: Secondary | ICD-10-CM | POA: Diagnosis not present

## 2022-12-31 DIAGNOSIS — G8929 Other chronic pain: Secondary | ICD-10-CM | POA: Diagnosis not present

## 2022-12-31 DIAGNOSIS — I10 Essential (primary) hypertension: Secondary | ICD-10-CM | POA: Diagnosis not present

## 2022-12-31 DIAGNOSIS — J449 Chronic obstructive pulmonary disease, unspecified: Secondary | ICD-10-CM | POA: Diagnosis not present

## 2022-12-31 DIAGNOSIS — R110 Nausea: Secondary | ICD-10-CM | POA: Diagnosis not present

## 2022-12-31 DIAGNOSIS — R600 Localized edema: Secondary | ICD-10-CM | POA: Diagnosis not present

## 2022-12-31 DIAGNOSIS — E2740 Unspecified adrenocortical insufficiency: Secondary | ICD-10-CM | POA: Diagnosis not present

## 2022-12-31 DIAGNOSIS — I2089 Other forms of angina pectoris: Secondary | ICD-10-CM | POA: Diagnosis not present

## 2022-12-31 DIAGNOSIS — M159 Polyosteoarthritis, unspecified: Secondary | ICD-10-CM | POA: Diagnosis not present

## 2022-12-31 DIAGNOSIS — J9611 Chronic respiratory failure with hypoxia: Secondary | ICD-10-CM | POA: Diagnosis not present

## 2023-01-14 DIAGNOSIS — J449 Chronic obstructive pulmonary disease, unspecified: Secondary | ICD-10-CM | POA: Diagnosis not present

## 2023-01-14 DIAGNOSIS — F17210 Nicotine dependence, cigarettes, uncomplicated: Secondary | ICD-10-CM | POA: Diagnosis not present

## 2023-01-14 DIAGNOSIS — I2510 Atherosclerotic heart disease of native coronary artery without angina pectoris: Secondary | ICD-10-CM | POA: Diagnosis not present

## 2023-01-14 DIAGNOSIS — J301 Allergic rhinitis due to pollen: Secondary | ICD-10-CM | POA: Diagnosis not present

## 2023-01-27 DIAGNOSIS — J9611 Chronic respiratory failure with hypoxia: Secondary | ICD-10-CM | POA: Diagnosis not present

## 2023-01-27 DIAGNOSIS — M549 Dorsalgia, unspecified: Secondary | ICD-10-CM | POA: Diagnosis not present

## 2023-01-27 DIAGNOSIS — G8929 Other chronic pain: Secondary | ICD-10-CM | POA: Diagnosis not present

## 2023-01-27 DIAGNOSIS — R600 Localized edema: Secondary | ICD-10-CM | POA: Diagnosis not present

## 2023-01-27 DIAGNOSIS — R110 Nausea: Secondary | ICD-10-CM | POA: Diagnosis not present

## 2023-01-27 DIAGNOSIS — J449 Chronic obstructive pulmonary disease, unspecified: Secondary | ICD-10-CM | POA: Diagnosis not present

## 2023-01-27 DIAGNOSIS — M159 Polyosteoarthritis, unspecified: Secondary | ICD-10-CM | POA: Diagnosis not present

## 2023-01-27 DIAGNOSIS — J069 Acute upper respiratory infection, unspecified: Secondary | ICD-10-CM | POA: Diagnosis not present

## 2023-01-27 DIAGNOSIS — E2740 Unspecified adrenocortical insufficiency: Secondary | ICD-10-CM | POA: Diagnosis not present

## 2023-01-27 DIAGNOSIS — I10 Essential (primary) hypertension: Secondary | ICD-10-CM | POA: Diagnosis not present

## 2023-02-25 DIAGNOSIS — G8929 Other chronic pain: Secondary | ICD-10-CM | POA: Diagnosis not present

## 2023-02-25 DIAGNOSIS — M159 Polyosteoarthritis, unspecified: Secondary | ICD-10-CM | POA: Diagnosis not present

## 2023-02-25 DIAGNOSIS — J9611 Chronic respiratory failure with hypoxia: Secondary | ICD-10-CM | POA: Diagnosis not present

## 2023-02-25 DIAGNOSIS — M549 Dorsalgia, unspecified: Secondary | ICD-10-CM | POA: Diagnosis not present

## 2023-02-25 DIAGNOSIS — R600 Localized edema: Secondary | ICD-10-CM | POA: Diagnosis not present

## 2023-02-25 DIAGNOSIS — I2089 Other forms of angina pectoris: Secondary | ICD-10-CM | POA: Diagnosis not present

## 2023-02-25 DIAGNOSIS — E2740 Unspecified adrenocortical insufficiency: Secondary | ICD-10-CM | POA: Diagnosis not present

## 2023-02-25 DIAGNOSIS — R110 Nausea: Secondary | ICD-10-CM | POA: Diagnosis not present

## 2023-02-25 DIAGNOSIS — I10 Essential (primary) hypertension: Secondary | ICD-10-CM | POA: Diagnosis not present

## 2023-02-25 DIAGNOSIS — J449 Chronic obstructive pulmonary disease, unspecified: Secondary | ICD-10-CM | POA: Diagnosis not present

## 2023-03-03 DIAGNOSIS — E2740 Unspecified adrenocortical insufficiency: Secondary | ICD-10-CM | POA: Insufficient documentation

## 2023-03-03 DIAGNOSIS — E039 Hypothyroidism, unspecified: Secondary | ICD-10-CM | POA: Insufficient documentation

## 2023-03-11 DIAGNOSIS — G8929 Other chronic pain: Secondary | ICD-10-CM | POA: Diagnosis not present

## 2023-03-11 DIAGNOSIS — R300 Dysuria: Secondary | ICD-10-CM | POA: Diagnosis not present

## 2023-03-11 DIAGNOSIS — R110 Nausea: Secondary | ICD-10-CM | POA: Diagnosis not present

## 2023-03-11 DIAGNOSIS — I10 Essential (primary) hypertension: Secondary | ICD-10-CM | POA: Diagnosis not present

## 2023-03-11 DIAGNOSIS — E785 Hyperlipidemia, unspecified: Secondary | ICD-10-CM | POA: Diagnosis not present

## 2023-03-11 DIAGNOSIS — R600 Localized edema: Secondary | ICD-10-CM | POA: Diagnosis not present

## 2023-03-11 DIAGNOSIS — M159 Polyosteoarthritis, unspecified: Secondary | ICD-10-CM | POA: Diagnosis not present

## 2023-03-11 DIAGNOSIS — E2740 Unspecified adrenocortical insufficiency: Secondary | ICD-10-CM | POA: Diagnosis not present

## 2023-03-11 DIAGNOSIS — J449 Chronic obstructive pulmonary disease, unspecified: Secondary | ICD-10-CM | POA: Diagnosis not present

## 2023-03-11 DIAGNOSIS — D509 Iron deficiency anemia, unspecified: Secondary | ICD-10-CM | POA: Diagnosis not present

## 2023-03-11 DIAGNOSIS — M549 Dorsalgia, unspecified: Secondary | ICD-10-CM | POA: Diagnosis not present

## 2023-03-11 DIAGNOSIS — J9611 Chronic respiratory failure with hypoxia: Secondary | ICD-10-CM | POA: Diagnosis not present

## 2023-03-15 DIAGNOSIS — R600 Localized edema: Secondary | ICD-10-CM | POA: Diagnosis not present

## 2023-03-15 DIAGNOSIS — J9611 Chronic respiratory failure with hypoxia: Secondary | ICD-10-CM | POA: Diagnosis not present

## 2023-03-15 DIAGNOSIS — M549 Dorsalgia, unspecified: Secondary | ICD-10-CM | POA: Diagnosis not present

## 2023-03-15 DIAGNOSIS — G8929 Other chronic pain: Secondary | ICD-10-CM | POA: Diagnosis not present

## 2023-03-15 DIAGNOSIS — E2740 Unspecified adrenocortical insufficiency: Secondary | ICD-10-CM | POA: Diagnosis not present

## 2023-03-15 DIAGNOSIS — R110 Nausea: Secondary | ICD-10-CM | POA: Diagnosis not present

## 2023-03-15 DIAGNOSIS — J449 Chronic obstructive pulmonary disease, unspecified: Secondary | ICD-10-CM | POA: Diagnosis not present

## 2023-03-15 DIAGNOSIS — M159 Polyosteoarthritis, unspecified: Secondary | ICD-10-CM | POA: Diagnosis not present

## 2023-03-15 DIAGNOSIS — I2089 Other forms of angina pectoris: Secondary | ICD-10-CM | POA: Diagnosis not present

## 2023-03-15 DIAGNOSIS — I10 Essential (primary) hypertension: Secondary | ICD-10-CM | POA: Diagnosis not present

## 2023-03-25 DIAGNOSIS — R110 Nausea: Secondary | ICD-10-CM | POA: Diagnosis not present

## 2023-03-25 DIAGNOSIS — J301 Allergic rhinitis due to pollen: Secondary | ICD-10-CM | POA: Diagnosis not present

## 2023-03-25 DIAGNOSIS — M549 Dorsalgia, unspecified: Secondary | ICD-10-CM | POA: Diagnosis not present

## 2023-03-25 DIAGNOSIS — J9611 Chronic respiratory failure with hypoxia: Secondary | ICD-10-CM | POA: Diagnosis not present

## 2023-03-25 DIAGNOSIS — F17210 Nicotine dependence, cigarettes, uncomplicated: Secondary | ICD-10-CM | POA: Diagnosis not present

## 2023-03-25 DIAGNOSIS — I10 Essential (primary) hypertension: Secondary | ICD-10-CM | POA: Diagnosis not present

## 2023-03-25 DIAGNOSIS — I2510 Atherosclerotic heart disease of native coronary artery without angina pectoris: Secondary | ICD-10-CM | POA: Diagnosis not present

## 2023-03-25 DIAGNOSIS — R600 Localized edema: Secondary | ICD-10-CM | POA: Diagnosis not present

## 2023-03-25 DIAGNOSIS — G8929 Other chronic pain: Secondary | ICD-10-CM | POA: Diagnosis not present

## 2023-03-25 DIAGNOSIS — M159 Polyosteoarthritis, unspecified: Secondary | ICD-10-CM | POA: Diagnosis not present

## 2023-03-25 DIAGNOSIS — E2740 Unspecified adrenocortical insufficiency: Secondary | ICD-10-CM | POA: Diagnosis not present

## 2023-03-25 DIAGNOSIS — J449 Chronic obstructive pulmonary disease, unspecified: Secondary | ICD-10-CM | POA: Diagnosis not present

## 2023-03-25 DIAGNOSIS — I2089 Other forms of angina pectoris: Secondary | ICD-10-CM | POA: Diagnosis not present

## 2023-04-07 DIAGNOSIS — R110 Nausea: Secondary | ICD-10-CM | POA: Diagnosis not present

## 2023-04-07 DIAGNOSIS — I2089 Other forms of angina pectoris: Secondary | ICD-10-CM | POA: Diagnosis not present

## 2023-04-07 DIAGNOSIS — E2740 Unspecified adrenocortical insufficiency: Secondary | ICD-10-CM | POA: Diagnosis not present

## 2023-04-07 DIAGNOSIS — R600 Localized edema: Secondary | ICD-10-CM | POA: Diagnosis not present

## 2023-04-07 DIAGNOSIS — J449 Chronic obstructive pulmonary disease, unspecified: Secondary | ICD-10-CM | POA: Diagnosis not present

## 2023-04-07 DIAGNOSIS — I10 Essential (primary) hypertension: Secondary | ICD-10-CM | POA: Diagnosis not present

## 2023-04-07 DIAGNOSIS — J9611 Chronic respiratory failure with hypoxia: Secondary | ICD-10-CM | POA: Diagnosis not present

## 2023-04-07 DIAGNOSIS — M549 Dorsalgia, unspecified: Secondary | ICD-10-CM | POA: Diagnosis not present

## 2023-04-07 DIAGNOSIS — G8929 Other chronic pain: Secondary | ICD-10-CM | POA: Diagnosis not present

## 2023-04-07 DIAGNOSIS — M159 Polyosteoarthritis, unspecified: Secondary | ICD-10-CM | POA: Diagnosis not present

## 2023-04-08 DIAGNOSIS — J449 Chronic obstructive pulmonary disease, unspecified: Secondary | ICD-10-CM | POA: Diagnosis not present

## 2023-04-08 DIAGNOSIS — J9611 Chronic respiratory failure with hypoxia: Secondary | ICD-10-CM | POA: Diagnosis not present

## 2023-04-08 DIAGNOSIS — J301 Allergic rhinitis due to pollen: Secondary | ICD-10-CM | POA: Diagnosis not present

## 2023-04-08 DIAGNOSIS — I2510 Atherosclerotic heart disease of native coronary artery without angina pectoris: Secondary | ICD-10-CM | POA: Diagnosis not present

## 2023-04-08 DIAGNOSIS — F17210 Nicotine dependence, cigarettes, uncomplicated: Secondary | ICD-10-CM | POA: Diagnosis not present

## 2023-04-12 DIAGNOSIS — J9611 Chronic respiratory failure with hypoxia: Secondary | ICD-10-CM | POA: Diagnosis not present

## 2023-04-12 DIAGNOSIS — E039 Hypothyroidism, unspecified: Secondary | ICD-10-CM | POA: Diagnosis not present

## 2023-04-12 DIAGNOSIS — G8929 Other chronic pain: Secondary | ICD-10-CM | POA: Diagnosis not present

## 2023-04-12 DIAGNOSIS — E785 Hyperlipidemia, unspecified: Secondary | ICD-10-CM | POA: Diagnosis not present

## 2023-04-12 DIAGNOSIS — J449 Chronic obstructive pulmonary disease, unspecified: Secondary | ICD-10-CM | POA: Diagnosis not present

## 2023-04-12 DIAGNOSIS — K219 Gastro-esophageal reflux disease without esophagitis: Secondary | ICD-10-CM | POA: Diagnosis not present

## 2023-04-12 DIAGNOSIS — E2740 Unspecified adrenocortical insufficiency: Secondary | ICD-10-CM | POA: Diagnosis not present

## 2023-04-12 DIAGNOSIS — Z9981 Dependence on supplemental oxygen: Secondary | ICD-10-CM | POA: Diagnosis not present

## 2023-04-12 DIAGNOSIS — R600 Localized edema: Secondary | ICD-10-CM | POA: Diagnosis not present

## 2023-04-12 DIAGNOSIS — I2089 Other forms of angina pectoris: Secondary | ICD-10-CM | POA: Diagnosis not present

## 2023-04-12 DIAGNOSIS — Z85118 Personal history of other malignant neoplasm of bronchus and lung: Secondary | ICD-10-CM | POA: Diagnosis not present

## 2023-04-12 DIAGNOSIS — M159 Polyosteoarthritis, unspecified: Secondary | ICD-10-CM | POA: Diagnosis not present

## 2023-04-12 DIAGNOSIS — I10 Essential (primary) hypertension: Secondary | ICD-10-CM | POA: Diagnosis not present

## 2023-04-12 DIAGNOSIS — D509 Iron deficiency anemia, unspecified: Secondary | ICD-10-CM | POA: Diagnosis not present

## 2023-04-12 DIAGNOSIS — M545 Low back pain, unspecified: Secondary | ICD-10-CM | POA: Diagnosis not present

## 2023-04-12 DIAGNOSIS — F17210 Nicotine dependence, cigarettes, uncomplicated: Secondary | ICD-10-CM | POA: Diagnosis not present

## 2023-04-14 DIAGNOSIS — J9611 Chronic respiratory failure with hypoxia: Secondary | ICD-10-CM | POA: Diagnosis not present

## 2023-04-14 DIAGNOSIS — R600 Localized edema: Secondary | ICD-10-CM | POA: Diagnosis not present

## 2023-04-14 DIAGNOSIS — D509 Iron deficiency anemia, unspecified: Secondary | ICD-10-CM | POA: Diagnosis not present

## 2023-04-14 DIAGNOSIS — F17210 Nicotine dependence, cigarettes, uncomplicated: Secondary | ICD-10-CM | POA: Diagnosis not present

## 2023-04-14 DIAGNOSIS — I2089 Other forms of angina pectoris: Secondary | ICD-10-CM | POA: Diagnosis not present

## 2023-04-14 DIAGNOSIS — G8929 Other chronic pain: Secondary | ICD-10-CM | POA: Diagnosis not present

## 2023-04-14 DIAGNOSIS — E2740 Unspecified adrenocortical insufficiency: Secondary | ICD-10-CM | POA: Diagnosis not present

## 2023-04-14 DIAGNOSIS — M159 Polyosteoarthritis, unspecified: Secondary | ICD-10-CM | POA: Diagnosis not present

## 2023-04-14 DIAGNOSIS — K219 Gastro-esophageal reflux disease without esophagitis: Secondary | ICD-10-CM | POA: Diagnosis not present

## 2023-04-14 DIAGNOSIS — J449 Chronic obstructive pulmonary disease, unspecified: Secondary | ICD-10-CM | POA: Diagnosis not present

## 2023-04-14 DIAGNOSIS — Z9981 Dependence on supplemental oxygen: Secondary | ICD-10-CM | POA: Diagnosis not present

## 2023-04-14 DIAGNOSIS — I10 Essential (primary) hypertension: Secondary | ICD-10-CM | POA: Diagnosis not present

## 2023-04-14 DIAGNOSIS — M545 Low back pain, unspecified: Secondary | ICD-10-CM | POA: Diagnosis not present

## 2023-04-14 DIAGNOSIS — E039 Hypothyroidism, unspecified: Secondary | ICD-10-CM | POA: Diagnosis not present

## 2023-04-14 DIAGNOSIS — Z85118 Personal history of other malignant neoplasm of bronchus and lung: Secondary | ICD-10-CM | POA: Diagnosis not present

## 2023-04-14 DIAGNOSIS — E785 Hyperlipidemia, unspecified: Secondary | ICD-10-CM | POA: Diagnosis not present

## 2023-04-15 DIAGNOSIS — F17210 Nicotine dependence, cigarettes, uncomplicated: Secondary | ICD-10-CM | POA: Diagnosis not present

## 2023-04-15 DIAGNOSIS — J9611 Chronic respiratory failure with hypoxia: Secondary | ICD-10-CM | POA: Diagnosis not present

## 2023-04-15 DIAGNOSIS — K219 Gastro-esophageal reflux disease without esophagitis: Secondary | ICD-10-CM | POA: Diagnosis not present

## 2023-04-15 DIAGNOSIS — Z85118 Personal history of other malignant neoplasm of bronchus and lung: Secondary | ICD-10-CM | POA: Diagnosis not present

## 2023-04-15 DIAGNOSIS — M159 Polyosteoarthritis, unspecified: Secondary | ICD-10-CM | POA: Diagnosis not present

## 2023-04-15 DIAGNOSIS — Z9981 Dependence on supplemental oxygen: Secondary | ICD-10-CM | POA: Diagnosis not present

## 2023-04-15 DIAGNOSIS — M545 Low back pain, unspecified: Secondary | ICD-10-CM | POA: Diagnosis not present

## 2023-04-15 DIAGNOSIS — E2740 Unspecified adrenocortical insufficiency: Secondary | ICD-10-CM | POA: Diagnosis not present

## 2023-04-15 DIAGNOSIS — E785 Hyperlipidemia, unspecified: Secondary | ICD-10-CM | POA: Diagnosis not present

## 2023-04-15 DIAGNOSIS — I10 Essential (primary) hypertension: Secondary | ICD-10-CM | POA: Diagnosis not present

## 2023-04-15 DIAGNOSIS — I2089 Other forms of angina pectoris: Secondary | ICD-10-CM | POA: Diagnosis not present

## 2023-04-15 DIAGNOSIS — G8929 Other chronic pain: Secondary | ICD-10-CM | POA: Diagnosis not present

## 2023-04-15 DIAGNOSIS — D509 Iron deficiency anemia, unspecified: Secondary | ICD-10-CM | POA: Diagnosis not present

## 2023-04-15 DIAGNOSIS — J449 Chronic obstructive pulmonary disease, unspecified: Secondary | ICD-10-CM | POA: Diagnosis not present

## 2023-04-15 DIAGNOSIS — R600 Localized edema: Secondary | ICD-10-CM | POA: Diagnosis not present

## 2023-04-15 DIAGNOSIS — E039 Hypothyroidism, unspecified: Secondary | ICD-10-CM | POA: Diagnosis not present

## 2023-04-16 DIAGNOSIS — I10 Essential (primary) hypertension: Secondary | ICD-10-CM | POA: Diagnosis not present

## 2023-04-16 DIAGNOSIS — E785 Hyperlipidemia, unspecified: Secondary | ICD-10-CM | POA: Diagnosis not present

## 2023-04-16 DIAGNOSIS — F17210 Nicotine dependence, cigarettes, uncomplicated: Secondary | ICD-10-CM | POA: Diagnosis not present

## 2023-04-16 DIAGNOSIS — G8929 Other chronic pain: Secondary | ICD-10-CM | POA: Diagnosis not present

## 2023-04-16 DIAGNOSIS — M159 Polyosteoarthritis, unspecified: Secondary | ICD-10-CM | POA: Diagnosis not present

## 2023-04-16 DIAGNOSIS — E2740 Unspecified adrenocortical insufficiency: Secondary | ICD-10-CM | POA: Diagnosis not present

## 2023-04-16 DIAGNOSIS — I2089 Other forms of angina pectoris: Secondary | ICD-10-CM | POA: Diagnosis not present

## 2023-04-16 DIAGNOSIS — R600 Localized edema: Secondary | ICD-10-CM | POA: Diagnosis not present

## 2023-04-16 DIAGNOSIS — Z85118 Personal history of other malignant neoplasm of bronchus and lung: Secondary | ICD-10-CM | POA: Diagnosis not present

## 2023-04-16 DIAGNOSIS — M545 Low back pain, unspecified: Secondary | ICD-10-CM | POA: Diagnosis not present

## 2023-04-16 DIAGNOSIS — J9611 Chronic respiratory failure with hypoxia: Secondary | ICD-10-CM | POA: Diagnosis not present

## 2023-04-16 DIAGNOSIS — K219 Gastro-esophageal reflux disease without esophagitis: Secondary | ICD-10-CM | POA: Diagnosis not present

## 2023-04-16 DIAGNOSIS — E039 Hypothyroidism, unspecified: Secondary | ICD-10-CM | POA: Diagnosis not present

## 2023-04-16 DIAGNOSIS — D509 Iron deficiency anemia, unspecified: Secondary | ICD-10-CM | POA: Diagnosis not present

## 2023-04-16 DIAGNOSIS — J449 Chronic obstructive pulmonary disease, unspecified: Secondary | ICD-10-CM | POA: Diagnosis not present

## 2023-04-16 DIAGNOSIS — Z9981 Dependence on supplemental oxygen: Secondary | ICD-10-CM | POA: Diagnosis not present

## 2023-04-19 DIAGNOSIS — I2089 Other forms of angina pectoris: Secondary | ICD-10-CM | POA: Diagnosis not present

## 2023-04-19 DIAGNOSIS — E2740 Unspecified adrenocortical insufficiency: Secondary | ICD-10-CM | POA: Diagnosis not present

## 2023-04-19 DIAGNOSIS — F17210 Nicotine dependence, cigarettes, uncomplicated: Secondary | ICD-10-CM | POA: Diagnosis not present

## 2023-04-19 DIAGNOSIS — K219 Gastro-esophageal reflux disease without esophagitis: Secondary | ICD-10-CM | POA: Diagnosis not present

## 2023-04-19 DIAGNOSIS — Z9981 Dependence on supplemental oxygen: Secondary | ICD-10-CM | POA: Diagnosis not present

## 2023-04-19 DIAGNOSIS — M159 Polyosteoarthritis, unspecified: Secondary | ICD-10-CM | POA: Diagnosis not present

## 2023-04-19 DIAGNOSIS — J9611 Chronic respiratory failure with hypoxia: Secondary | ICD-10-CM | POA: Diagnosis not present

## 2023-04-19 DIAGNOSIS — I10 Essential (primary) hypertension: Secondary | ICD-10-CM | POA: Diagnosis not present

## 2023-04-19 DIAGNOSIS — R600 Localized edema: Secondary | ICD-10-CM | POA: Diagnosis not present

## 2023-04-19 DIAGNOSIS — E039 Hypothyroidism, unspecified: Secondary | ICD-10-CM | POA: Diagnosis not present

## 2023-04-19 DIAGNOSIS — J449 Chronic obstructive pulmonary disease, unspecified: Secondary | ICD-10-CM | POA: Diagnosis not present

## 2023-04-19 DIAGNOSIS — E785 Hyperlipidemia, unspecified: Secondary | ICD-10-CM | POA: Diagnosis not present

## 2023-04-19 DIAGNOSIS — M545 Low back pain, unspecified: Secondary | ICD-10-CM | POA: Diagnosis not present

## 2023-04-19 DIAGNOSIS — G8929 Other chronic pain: Secondary | ICD-10-CM | POA: Diagnosis not present

## 2023-04-19 DIAGNOSIS — Z85118 Personal history of other malignant neoplasm of bronchus and lung: Secondary | ICD-10-CM | POA: Diagnosis not present

## 2023-04-19 DIAGNOSIS — D509 Iron deficiency anemia, unspecified: Secondary | ICD-10-CM | POA: Diagnosis not present

## 2023-04-20 DIAGNOSIS — G8929 Other chronic pain: Secondary | ICD-10-CM | POA: Diagnosis not present

## 2023-04-20 DIAGNOSIS — F17210 Nicotine dependence, cigarettes, uncomplicated: Secondary | ICD-10-CM | POA: Diagnosis not present

## 2023-04-20 DIAGNOSIS — D509 Iron deficiency anemia, unspecified: Secondary | ICD-10-CM | POA: Diagnosis not present

## 2023-04-20 DIAGNOSIS — K219 Gastro-esophageal reflux disease without esophagitis: Secondary | ICD-10-CM | POA: Diagnosis not present

## 2023-04-20 DIAGNOSIS — Z9981 Dependence on supplemental oxygen: Secondary | ICD-10-CM | POA: Diagnosis not present

## 2023-04-20 DIAGNOSIS — I2089 Other forms of angina pectoris: Secondary | ICD-10-CM | POA: Diagnosis not present

## 2023-04-20 DIAGNOSIS — I10 Essential (primary) hypertension: Secondary | ICD-10-CM | POA: Diagnosis not present

## 2023-04-20 DIAGNOSIS — Z85118 Personal history of other malignant neoplasm of bronchus and lung: Secondary | ICD-10-CM | POA: Diagnosis not present

## 2023-04-20 DIAGNOSIS — E039 Hypothyroidism, unspecified: Secondary | ICD-10-CM | POA: Diagnosis not present

## 2023-04-20 DIAGNOSIS — M159 Polyosteoarthritis, unspecified: Secondary | ICD-10-CM | POA: Diagnosis not present

## 2023-04-20 DIAGNOSIS — E785 Hyperlipidemia, unspecified: Secondary | ICD-10-CM | POA: Diagnosis not present

## 2023-04-20 DIAGNOSIS — J449 Chronic obstructive pulmonary disease, unspecified: Secondary | ICD-10-CM | POA: Diagnosis not present

## 2023-04-20 DIAGNOSIS — R600 Localized edema: Secondary | ICD-10-CM | POA: Diagnosis not present

## 2023-04-20 DIAGNOSIS — E2740 Unspecified adrenocortical insufficiency: Secondary | ICD-10-CM | POA: Diagnosis not present

## 2023-04-20 DIAGNOSIS — J9611 Chronic respiratory failure with hypoxia: Secondary | ICD-10-CM | POA: Diagnosis not present

## 2023-04-20 DIAGNOSIS — M545 Low back pain, unspecified: Secondary | ICD-10-CM | POA: Diagnosis not present

## 2023-04-22 DIAGNOSIS — E2740 Unspecified adrenocortical insufficiency: Secondary | ICD-10-CM | POA: Diagnosis not present

## 2023-04-22 DIAGNOSIS — G8929 Other chronic pain: Secondary | ICD-10-CM | POA: Diagnosis not present

## 2023-04-22 DIAGNOSIS — R110 Nausea: Secondary | ICD-10-CM | POA: Diagnosis not present

## 2023-04-22 DIAGNOSIS — J069 Acute upper respiratory infection, unspecified: Secondary | ICD-10-CM | POA: Diagnosis not present

## 2023-04-22 DIAGNOSIS — I10 Essential (primary) hypertension: Secondary | ICD-10-CM | POA: Diagnosis not present

## 2023-04-22 DIAGNOSIS — M159 Polyosteoarthritis, unspecified: Secondary | ICD-10-CM | POA: Diagnosis not present

## 2023-04-22 DIAGNOSIS — M549 Dorsalgia, unspecified: Secondary | ICD-10-CM | POA: Diagnosis not present

## 2023-04-22 DIAGNOSIS — J9611 Chronic respiratory failure with hypoxia: Secondary | ICD-10-CM | POA: Diagnosis not present

## 2023-04-22 DIAGNOSIS — J449 Chronic obstructive pulmonary disease, unspecified: Secondary | ICD-10-CM | POA: Diagnosis not present

## 2023-04-22 DIAGNOSIS — R600 Localized edema: Secondary | ICD-10-CM | POA: Diagnosis not present

## 2023-04-27 ENCOUNTER — Other Ambulatory Visit: Payer: Self-pay

## 2023-04-27 ENCOUNTER — Inpatient Hospital Stay (HOSPITAL_COMMUNITY)
Admission: EM | Admit: 2023-04-27 | Discharge: 2023-05-01 | DRG: 193 | Disposition: A | Discharge status: Home / Self Care | Payer: 59 | Source: Ambulatory Visit | Attending: Internal Medicine | Admitting: Internal Medicine

## 2023-04-27 ENCOUNTER — Emergency Department (HOSPITAL_COMMUNITY): Payer: 59

## 2023-04-27 ENCOUNTER — Encounter (HOSPITAL_COMMUNITY): Payer: Self-pay

## 2023-04-27 DIAGNOSIS — I2510 Atherosclerotic heart disease of native coronary artery without angina pectoris: Secondary | ICD-10-CM | POA: Diagnosis present

## 2023-04-27 DIAGNOSIS — J189 Pneumonia, unspecified organism: Principal | ICD-10-CM

## 2023-04-27 DIAGNOSIS — J101 Influenza due to other identified influenza virus with other respiratory manifestations: Secondary | ICD-10-CM | POA: Diagnosis present

## 2023-04-27 DIAGNOSIS — I959 Hypotension, unspecified: Secondary | ICD-10-CM | POA: Diagnosis not present

## 2023-04-27 DIAGNOSIS — E039 Hypothyroidism, unspecified: Secondary | ICD-10-CM | POA: Diagnosis present

## 2023-04-27 DIAGNOSIS — Z7982 Long term (current) use of aspirin: Secondary | ICD-10-CM

## 2023-04-27 DIAGNOSIS — E875 Hyperkalemia: Secondary | ICD-10-CM | POA: Diagnosis present

## 2023-04-27 DIAGNOSIS — R0902 Hypoxemia: Secondary | ICD-10-CM

## 2023-04-27 DIAGNOSIS — I213 ST elevation (STEMI) myocardial infarction of unspecified site: Secondary | ICD-10-CM | POA: Diagnosis not present

## 2023-04-27 DIAGNOSIS — J9611 Chronic respiratory failure with hypoxia: Secondary | ICD-10-CM | POA: Diagnosis not present

## 2023-04-27 DIAGNOSIS — Z885 Allergy status to narcotic agent status: Secondary | ICD-10-CM

## 2023-04-27 DIAGNOSIS — E2740 Unspecified adrenocortical insufficiency: Secondary | ICD-10-CM | POA: Diagnosis present

## 2023-04-27 DIAGNOSIS — F17210 Nicotine dependence, cigarettes, uncomplicated: Secondary | ICD-10-CM | POA: Diagnosis present

## 2023-04-27 DIAGNOSIS — E43 Unspecified severe protein-calorie malnutrition: Secondary | ICD-10-CM | POA: Diagnosis present

## 2023-04-27 DIAGNOSIS — I499 Cardiac arrhythmia, unspecified: Secondary | ICD-10-CM | POA: Diagnosis not present

## 2023-04-27 DIAGNOSIS — D72818 Other decreased white blood cell count: Secondary | ICD-10-CM | POA: Diagnosis present

## 2023-04-27 DIAGNOSIS — Z8249 Family history of ischemic heart disease and other diseases of the circulatory system: Secondary | ICD-10-CM | POA: Diagnosis not present

## 2023-04-27 DIAGNOSIS — R9431 Abnormal electrocardiogram [ECG] [EKG]: Secondary | ICD-10-CM | POA: Diagnosis not present

## 2023-04-27 DIAGNOSIS — E8729 Other acidosis: Secondary | ICD-10-CM | POA: Diagnosis present

## 2023-04-27 DIAGNOSIS — I351 Nonrheumatic aortic (valve) insufficiency: Secondary | ICD-10-CM | POA: Diagnosis present

## 2023-04-27 DIAGNOSIS — F319 Bipolar disorder, unspecified: Secondary | ICD-10-CM | POA: Diagnosis present

## 2023-04-27 DIAGNOSIS — Z902 Acquired absence of lung [part of]: Secondary | ICD-10-CM | POA: Diagnosis not present

## 2023-04-27 DIAGNOSIS — J09X1 Influenza due to identified novel influenza A virus with pneumonia: Secondary | ICD-10-CM | POA: Diagnosis not present

## 2023-04-27 DIAGNOSIS — Z85118 Personal history of other malignant neoplasm of bronchus and lung: Secondary | ICD-10-CM

## 2023-04-27 DIAGNOSIS — Z681 Body mass index (BMI) 19 or less, adult: Secondary | ICD-10-CM | POA: Diagnosis not present

## 2023-04-27 DIAGNOSIS — Z1152 Encounter for screening for COVID-19: Secondary | ICD-10-CM

## 2023-04-27 DIAGNOSIS — Z79899 Other long term (current) drug therapy: Secondary | ICD-10-CM

## 2023-04-27 DIAGNOSIS — R0602 Shortness of breath: Secondary | ICD-10-CM | POA: Diagnosis present

## 2023-04-27 DIAGNOSIS — F419 Anxiety disorder, unspecified: Secondary | ICD-10-CM | POA: Diagnosis present

## 2023-04-27 DIAGNOSIS — Z825 Family history of asthma and other chronic lower respiratory diseases: Secondary | ICD-10-CM

## 2023-04-27 DIAGNOSIS — R918 Other nonspecific abnormal finding of lung field: Secondary | ICD-10-CM | POA: Diagnosis not present

## 2023-04-27 DIAGNOSIS — J432 Centrilobular emphysema: Secondary | ICD-10-CM | POA: Diagnosis present

## 2023-04-27 DIAGNOSIS — Z955 Presence of coronary angioplasty implant and graft: Secondary | ICD-10-CM

## 2023-04-27 DIAGNOSIS — J9621 Acute and chronic respiratory failure with hypoxia: Secondary | ICD-10-CM | POA: Diagnosis present

## 2023-04-27 DIAGNOSIS — J1001 Influenza due to other identified influenza virus with the same other identified influenza virus pneumonia: Secondary | ICD-10-CM | POA: Diagnosis present

## 2023-04-27 DIAGNOSIS — E785 Hyperlipidemia, unspecified: Secondary | ICD-10-CM | POA: Diagnosis present

## 2023-04-27 DIAGNOSIS — R079 Chest pain, unspecified: Secondary | ICD-10-CM

## 2023-04-27 DIAGNOSIS — Z9981 Dependence on supplemental oxygen: Secondary | ICD-10-CM | POA: Diagnosis not present

## 2023-04-27 DIAGNOSIS — J9622 Acute and chronic respiratory failure with hypercapnia: Secondary | ICD-10-CM | POA: Diagnosis present

## 2023-04-27 DIAGNOSIS — J9691 Respiratory failure, unspecified with hypoxia: Secondary | ICD-10-CM | POA: Diagnosis present

## 2023-04-27 DIAGNOSIS — J9601 Acute respiratory failure with hypoxia: Secondary | ICD-10-CM | POA: Diagnosis present

## 2023-04-27 DIAGNOSIS — J441 Chronic obstructive pulmonary disease with (acute) exacerbation: Secondary | ICD-10-CM | POA: Diagnosis present

## 2023-04-27 DIAGNOSIS — I252 Old myocardial infarction: Secondary | ICD-10-CM

## 2023-04-27 DIAGNOSIS — J440 Chronic obstructive pulmonary disease with acute lower respiratory infection: Secondary | ICD-10-CM | POA: Diagnosis present

## 2023-04-27 DIAGNOSIS — D6959 Other secondary thrombocytopenia: Secondary | ICD-10-CM | POA: Diagnosis present

## 2023-04-27 DIAGNOSIS — I10 Essential (primary) hypertension: Secondary | ICD-10-CM | POA: Diagnosis present

## 2023-04-27 DIAGNOSIS — Z880 Allergy status to penicillin: Secondary | ICD-10-CM

## 2023-04-27 DIAGNOSIS — Z90710 Acquired absence of both cervix and uterus: Secondary | ICD-10-CM

## 2023-04-27 DIAGNOSIS — J9602 Acute respiratory failure with hypercapnia: Secondary | ICD-10-CM | POA: Diagnosis not present

## 2023-04-27 DIAGNOSIS — R519 Headache, unspecified: Secondary | ICD-10-CM | POA: Diagnosis not present

## 2023-04-27 LAB — HEPATIC FUNCTION PANEL
ALT: 10 U/L (ref 0–44)
AST: 32 U/L (ref 15–41)
Albumin: 2.7 g/dL — ABNORMAL LOW (ref 3.5–5.0)
Alkaline Phosphatase: 57 U/L (ref 38–126)
Bilirubin, Direct: 0.4 mg/dL — ABNORMAL HIGH (ref 0.0–0.2)
Indirect Bilirubin: 0.3 mg/dL (ref 0.3–0.9)
Total Bilirubin: 0.7 mg/dL (ref 0.0–1.2)
Total Protein: 5.8 g/dL — ABNORMAL LOW (ref 6.5–8.1)

## 2023-04-27 LAB — BASIC METABOLIC PANEL
Anion gap: 8 (ref 5–15)
BUN: 17 mg/dL (ref 6–20)
CO2: 40 mmol/L — ABNORMAL HIGH (ref 22–32)
Calcium: 8.4 mg/dL — ABNORMAL LOW (ref 8.9–10.3)
Chloride: 87 mmol/L — ABNORMAL LOW (ref 98–111)
Creatinine, Ser: 0.69 mg/dL (ref 0.44–1.00)
GFR, Estimated: >60 mL/min (ref >60)
Glucose, Bld: 108 mg/dL — ABNORMAL HIGH (ref 70–99)
Potassium: 4.9 mmol/L (ref 3.5–5.1)
Sodium: 135 mmol/L (ref 135–145)

## 2023-04-27 LAB — CBC
HCT: 42.4 % (ref 36.0–46.0)
Hemoglobin: 12.9 g/dL (ref 12.0–15.0)
MCH: 31.2 pg (ref 26.0–34.0)
MCHC: 30.4 g/dL (ref 30.0–36.0)
MCV: 102.4 fL — ABNORMAL HIGH (ref 80.0–100.0)
Platelets: 80 10*3/uL — ABNORMAL LOW (ref 150–400)
RBC: 4.14 MIL/uL (ref 3.87–5.11)
RDW: 13.1 % (ref 11.5–15.5)
WBC: 3.5 10*3/uL — ABNORMAL LOW (ref 4.0–10.5)
nRBC: 0 % (ref 0.0–0.2)

## 2023-04-27 LAB — RESP PANEL BY RT-PCR (RSV, FLU A&B, COVID)  RVPGX2
Influenza A by PCR: POSITIVE — AB
Influenza B by PCR: NEGATIVE
Resp Syncytial Virus by PCR: NEGATIVE
SARS Coronavirus 2 by RT PCR: NEGATIVE

## 2023-04-27 LAB — TROPONIN I (HIGH SENSITIVITY)
Troponin I (High Sensitivity): 3 ng/L (ref <18)
Troponin I (High Sensitivity): 3 ng/L (ref <18)

## 2023-04-27 LAB — LIPASE, BLOOD: Lipase: 28 U/L (ref 11–51)

## 2023-04-27 LAB — BRAIN NATRIURETIC PEPTIDE: B Natriuretic Peptide: 39.2 pg/mL (ref 0.0–100.0)

## 2023-04-27 MED ORDER — SODIUM CHLORIDE 0.9 % IV SOLN
500.0000 mg | Freq: Once | INTRAVENOUS | Status: AC
  Administered 2023-04-27: 500 mg via INTRAVENOUS
  Filled 2023-04-27: qty 5

## 2023-04-27 MED ORDER — IPRATROPIUM-ALBUTEROL 0.5-2.5 (3) MG/3ML IN SOLN
3.0000 mL | Freq: Once | RESPIRATORY_TRACT | Status: AC
  Administered 2023-04-27: 3 mL via RESPIRATORY_TRACT
  Filled 2023-04-27: qty 3

## 2023-04-27 MED ORDER — SODIUM CHLORIDE 0.9 % IV SOLN
1.0000 g | Freq: Once | INTRAVENOUS | Status: AC
  Administered 2023-04-27: 1 g via INTRAVENOUS
  Filled 2023-04-27: qty 10

## 2023-04-27 MED ORDER — METHYLPREDNISOLONE SODIUM SUCC 125 MG IJ SOLR
125.0000 mg | Freq: Once | INTRAMUSCULAR | Status: AC
  Administered 2023-04-28: 125 mg via INTRAVENOUS
  Filled 2023-04-27: qty 2

## 2023-04-27 NOTE — ED Notes (Signed)
 Pt showing an increased work of breathing with SpO2 in 80s. Pt currently on 5L [place]. RT called to see patient.

## 2023-04-27 NOTE — ED Provider Notes (Signed)
 [place] EMERGENCY DEPARTMENT AT [facility] Provider Note   CSN: [id number] Arrival date & time: 04/27/23  1808     History  Chief Complaint  Patient presents with  . Code STEMI    Tami Zamora is a 58 y.o. female.  HPI   58 year old female presents emergency department with worsening shortness of breath and hypoxia.  Past medical history of COPD, active smoker, lung cancer status post lobectomy on 2 L nasal cannula.  Over the past week he has been endorsing chills, harsh cough, increased oxygen requirement, intermittent chest pain.  Patient is a poor historian as she is sleepy on exam which the family member at bedside states has been ongoing for her over the past week.  Home Medications Prior to Admission medications   Medication Sig Start Date End Date Taking? Authorizing Provider  albuterol (VENTOLIN HFA) 108 (90 Base) MCG/ACT inhaler Inhale 2 puffs into the lungs every 4 (four) hours as needed for wheezing or shortness of breath.     [provider]  ALPRAZolam Prudy Feeler) 0.25 MG tablet Take 0.25 mg by mouth 2 (two) times daily.     [provider]  aspirin 81 MG chewable tablet Chew 81 mg by mouth daily.    [provider]  atorvastatin (LIPITOR) 80 MG tablet Take 80 mg by mouth daily at 6 PM.  05/12/18   [provider]  diltiazem (CARDIZEM CD) 240 MG 24 hr capsule Take 1 capsule (240 mg total) by mouth daily. 04/14/19 07/13/19  Tobb, Kardie, DO  doxycycline (VIBRA-TABS) 100 MG tablet Take 100 mg by mouth 2 (two) times daily. 05/11/19   [provider]  isosorbide mononitrate (IMDUR) 30 MG 24 hr tablet **NO REFILLS UNTIL SEEN IN OFFICE** 04/14/19   Tobb, Kardie, DO  nitroGLYCERIN (NITROSTAT) 0.4 MG SL tablet Place 1 tablet (0.4 mg total) under the tongue every 5 (five) minutes as needed for chest pain. 09/09/18 05/22/19  Baldo Daub, MD  omeprazole (PRILOSEC) 40 MG capsule Take 40 mg by mouth daily. 05/06/18   [provider]  OXYGEN Inhale 2 L into the lungs daily as needed (oxygen level below 90).    [provider]  pregabalin (LYRICA) 100 MG capsule Take 100 mg by mouth daily.  05/02/18   [provider]  valACYclovir (VALTREX) 500 MG tablet Take 500 mg by mouth daily.    [provider]      Allergies    Morphine, Morphine and codeine, and Penicillins    Review of Systems   Review of Systems  Constitutional:  Positive for appetite change, chills and fatigue.  HENT:  Positive for rhinorrhea.   Respiratory:  Positive for cough and shortness of breath.   Cardiovascular:  Positive for chest pain. Negative for leg swelling.  Gastrointestinal:  Negative for abdominal pain, diarrhea and vomiting.  Skin:  Negative for rash.  Neurological:  Negative for headaches.    Physical Exam Updated Vital Signs BP 120/65   Pulse 64   Temp 98.1 F (36.7 C) (Oral)   Resp 20   Ht 5' (1.524 m)   Wt 44.5 kg   SpO2 100%   BMI 19.14 kg/m  Physical Exam Vitals and nursing note reviewed.  Constitutional:      Appearance: Normal appearance. She is ill-appearing.  HENT:     Head: Normocephalic.     Mouth/Throat:     Mouth: Mucous membranes are moist.  Cardiovascular:     Rate  and Rhythm: Normal rate.  Pulmonary:     Effort: Pulmonary effort is normal. No respiratory distress.     Breath sounds: Wheezing and rhonchi present.  Abdominal:     Palpations: Abdomen is soft.     Tenderness: There is no abdominal tenderness.  Skin:    General: Skin is warm.  Neurological:     Mental Status: She is alert and oriented to person, place, and time. Mental status is at baseline.  Psychiatric:        Mood and Affect: Mood normal.    ED Results / Procedures / Treatments   Labs (all labs ordered are listed, but only abnormal results are displayed) Labs Reviewed  RESP PANEL BY RT-PCR (RSV, FLU A&B, COVID)  [id number]  BASIC METABOLIC PANEL  CBC  HEPATIC FUNCTION PANEL  LIPASE, BLOOD   BRAIN NATRIURETIC PEPTIDE  TROPONIN I (HIGH SENSITIVITY)    EKG EKG Interpretation Date/Time:  Tuesday April 27 2023 18:19:15 EST Ventricular Rate:  65 PR Interval:  146 QRS Duration:  87 QT Interval:  435 QTC Calculation: 453 R Axis:   82  Text Interpretation: Sinus or ectopic atrial rhythm Left ventricular hypertrophy Nonspecific T abnrm, anterolateral leads ST elevation, consider inferior injury Confirmed by Coralee Pesa [phone]) on 04/27/2023 6:40:12 PM  Radiology DG Chest 2 View Result Date: 04/27/2023 CLINICAL DATA:  Chest pain and shortness of breath. EXAM: CHEST - 2 VIEW COMPARISON:  May 19, 2019 FINDINGS: The heart size and mediastinal contours are within normal limits. Mild, chronic appearing increased interstitial lung markings are seen. Surgical sutures and surgical clips are present overlying the left hilar region. Mild infiltrates are noted within the mid left lung and bilateral lung bases. No pleural effusion or pneumothorax is identified. The visualized skeletal structures are unremarkable. IMPRESSION: Mild mid left lung and bibasilar infiltrates. Electronically Signed   By: Aram Candela M.D.   On: 04/27/2023 19:31    Procedures .Critical Care  Performed by: Rozelle Logan, DO Authorized by: Rozelle Logan, DO   Critical care provider statement:    Critical care time (minutes):  30   Critical care was necessary to treat or prevent imminent or life-threatening deterioration of the following conditions:  Respiratory failure   Critical care was time spent personally by me on the following activities:  Development of treatment plan with patient or surrogate, discussions with consultants, evaluation of patient's response to treatment, examination of patient, ordering and review of laboratory studies, ordering and review of radiographic studies, ordering and performing treatments and interventions, pulse oximetry, re-evaluation of patient's condition and  review of old charts   I assumed direction of critical care for this patient from another provider in my specialty: no     Care discussed with: admitting provider       Medications Ordered in ED Medications - No data to display  ED Course/ Medical Decision Making/ A&P                                 Medical Decision Making Amount and/or Complexity of Data Reviewed Labs: ordered. Radiology: ordered.  Risk Prescription drug management. Decision regarding hospitalization.   58 year old female with baseline oxygen requirement presents to the emergency department with worsening shortness of breath, hypoxia and cough.  She is currently on 5 L nasal cannula to maintain oxygen.  Daughter at bedside reports that her baseline is around 90% at rest.  Workup  today is significant for pneumonia, she is influenza A positive.  Antibiotics have been ordered.  Also symptomatic treatment of her shortness of breath/wheezing.  Cardiac workup is negative, no signs of sepsis.  Patients evaluation and results requires admission for further treatment and care.  Spoke with hospitalist, reviewed patient's ED course and they accept admission.  Patient agrees with admission plan, offers no new complaints and is stable/unchanged at time of admit.         Final Clinical Impression(s) / ED Diagnoses Final diagnoses:  None    Rx / DC Orders ED Discharge Orders     None         Rozelle Logan, DO 04/27/23 2349

## 2023-04-27 NOTE — Progress Notes (Signed)
 Interventional cardiology note:   GEN: No acute distress.   HEENT:  MMM, no JVD, no scleral icterus Cardiac: RRR, no murmurs, rubs, or gallops.  Center chest tender to palpation Respiratory: Clear to auscultation bilaterally. GI: Soft, nontender, non-distended  MS: No edema; No deformity. Neuro:  Nonfocal  Vasc:  +2 radial pulses  Code STEMI activated.  Patient is a 58 year old with a history of coronary artery disease status post inferior STEMI with PCI of obtuse marginal in 2020, hypertension, and hyperlipidemia.  Her complaint today is shortness of breath.  She also has chest pain.  Her pain is mainly in the center of her chest and the pain is exacerbated by palpation.  EKG on my review demonstrates sinus rhythm without ST elevations.  Will defer emergency coronary angiography at this point in time.  Alverda Skeans, MD Pager [phone]

## 2023-04-27 NOTE — ED Notes (Addendum)
 Pt placed on NRB. RT at bedside. EDP notified of situation.

## 2023-04-27 NOTE — ED Triage Notes (Signed)
 Pt bib  EMS from [facility] with SOB x1 week, no chest pain. Pt reports mild back pain x1 week. Lung sounds clear. Hx of lung cancer with 2L [place]. ON 2L [place], 70S% at UC. 99% SpO2 with EMS on 4L. Pt does have MI hx. Pt denies chest pain with EMS but reports mid chest pain during triage.  EMS reports elevation in inferior leads.  EMS vital signs:  112/60 82 HR 99% on 4L 108 cbg  97.3 temp tympanic  18 RAC  324 aspirin

## 2023-04-28 DIAGNOSIS — J432 Centrilobular emphysema: Secondary | ICD-10-CM

## 2023-04-28 DIAGNOSIS — I2510 Atherosclerotic heart disease of native coronary artery without angina pectoris: Secondary | ICD-10-CM

## 2023-04-28 DIAGNOSIS — J09X1 Influenza due to identified novel influenza A virus with pneumonia: Secondary | ICD-10-CM | POA: Diagnosis not present

## 2023-04-28 DIAGNOSIS — J9691 Respiratory failure, unspecified with hypoxia: Secondary | ICD-10-CM | POA: Diagnosis present

## 2023-04-28 DIAGNOSIS — J9602 Acute respiratory failure with hypercapnia: Secondary | ICD-10-CM

## 2023-04-28 DIAGNOSIS — J441 Chronic obstructive pulmonary disease with (acute) exacerbation: Secondary | ICD-10-CM

## 2023-04-28 DIAGNOSIS — J9601 Acute respiratory failure with hypoxia: Secondary | ICD-10-CM

## 2023-04-28 LAB — BLOOD GAS, ARTERIAL
Acid-Base Excess: 15.9 mmol/L — ABNORMAL HIGH (ref 0.0–2.0)
Bicarbonate: 49.6 mmol/L — ABNORMAL HIGH (ref 20.0–28.0)
Drawn by: 51147
O2 Saturation: 100 %
Patient temperature: 36.7
pCO2 arterial: 123 mmHg (ref 32–48)
pH, Arterial: 7.21 — ABNORMAL LOW (ref 7.35–7.45)
pO2, Arterial: 231 mmHg — ABNORMAL HIGH (ref 83–108)

## 2023-04-28 LAB — HIV ANTIBODY (ROUTINE TESTING W REFLEX): HIV Screen 4th Generation wRfx: NONREACTIVE

## 2023-04-28 LAB — PROCALCITONIN: Procalcitonin: <0.1 ng/mL

## 2023-04-28 MED ORDER — PREGABALIN 100 MG PO CAPS
100.0000 mg | ORAL_CAPSULE | Freq: Two times a day (BID) | ORAL | Status: DC
  Administered 2023-04-28 – 2023-05-01 (×7): 100 mg via ORAL
  Filled 2023-04-28 (×7): qty 1

## 2023-04-28 MED ORDER — ENOXAPARIN SODIUM 30 MG/0.3ML IJ SOSY
30.0000 mg | PREFILLED_SYRINGE | INTRAMUSCULAR | Status: DC
  Filled 2023-04-28 (×2): qty 0.3

## 2023-04-28 MED ORDER — DULOXETINE HCL 30 MG PO CPEP
30.0000 mg | ORAL_CAPSULE | Freq: Two times a day (BID) | ORAL | Status: DC
  Administered 2023-04-28 – 2023-05-01 (×7): 30 mg via ORAL
  Filled 2023-04-28 (×7): qty 1

## 2023-04-28 MED ORDER — METHYLPREDNISOLONE SODIUM SUCC 40 MG IJ SOLR
40.0000 mg | Freq: Two times a day (BID) | INTRAMUSCULAR | Status: DC
  Administered 2023-04-28 – 2023-04-30 (×4): 40 mg via INTRAVENOUS
  Filled 2023-04-28 (×4): qty 1

## 2023-04-28 MED ORDER — ONDANSETRON HCL 4 MG PO TABS: 4.0000 mg | ORAL_TABLET | Freq: Four times a day (QID) | ORAL | Status: DC | PRN

## 2023-04-28 MED ORDER — SODIUM CHLORIDE 0.9 % IV SOLN
1.0000 g | INTRAVENOUS | Status: DC
  Administered 2023-04-28: 1 g via INTRAVENOUS
  Filled 2023-04-28: qty 10

## 2023-04-28 MED ORDER — IPRATROPIUM-ALBUTEROL 0.5-2.5 (3) MG/3ML IN SOLN
3.0000 mL | Freq: Four times a day (QID) | RESPIRATORY_TRACT | Status: DC
  Administered 2023-04-28 – 2023-04-29 (×7): 3 mL via RESPIRATORY_TRACT
  Filled 2023-04-28 (×7): qty 3

## 2023-04-28 MED ORDER — SODIUM CHLORIDE 0.9 % IV SOLN
500.0000 mg | INTRAVENOUS | Status: DC
  Administered 2023-04-28: 500 mg via INTRAVENOUS
  Filled 2023-04-28: qty 5

## 2023-04-28 MED ORDER — ACETAMINOPHEN 325 MG PO TABS: 650.0000 mg | ORAL_TABLET | Freq: Four times a day (QID) | ORAL | Status: DC | PRN

## 2023-04-28 MED ORDER — ALPRAZOLAM 0.25 MG PO TABS
0.2500 mg | ORAL_TABLET | Freq: Two times a day (BID) | ORAL | Status: DC | PRN
  Administered 2023-04-28 – 2023-05-01 (×6): 0.25 mg via ORAL
  Filled 2023-04-28 (×6): qty 1

## 2023-04-28 MED ORDER — METOPROLOL SUCCINATE ER 25 MG PO TB24
25.0000 mg | ORAL_TABLET | Freq: Every day | ORAL | Status: DC
  Administered 2023-04-28 – 2023-04-29 (×2): 25 mg via ORAL
  Filled 2023-04-28 (×3): qty 1

## 2023-04-28 MED ORDER — BISACODYL 5 MG PO TBEC: 5.0000 mg | DELAYED_RELEASE_TABLET | Freq: Every day | ORAL | Status: DC | PRN

## 2023-04-28 MED ORDER — ACETAMINOPHEN 650 MG RE SUPP: 650.0000 mg | Freq: Four times a day (QID) | RECTAL | Status: DC | PRN

## 2023-04-28 MED ORDER — ASPIRIN 81 MG PO CHEW
81.0000 mg | CHEWABLE_TABLET | Freq: Every day | ORAL | Status: DC
  Administered 2023-04-28 – 2023-05-01 (×4): 81 mg via ORAL
  Filled 2023-04-28 (×4): qty 1

## 2023-04-28 MED ORDER — FLUTICASONE FUROATE-VILANTEROL 100-25 MCG/ACT IN AEPB
1.0000 | INHALATION_SPRAY | Freq: Every day | RESPIRATORY_TRACT | Status: DC
  Administered 2023-04-29 – 2023-05-01 (×3): 1 via RESPIRATORY_TRACT
  Filled 2023-04-28: qty 28

## 2023-04-28 MED ORDER — VALACYCLOVIR HCL 500 MG PO TABS
500.0000 mg | ORAL_TABLET | Freq: Every day | ORAL | Status: DC
  Administered 2023-04-28 – 2023-05-01 (×4): 500 mg via ORAL
  Filled 2023-04-28 (×4): qty 1

## 2023-04-28 MED ORDER — ONDANSETRON HCL 4 MG/2ML IJ SOLN
4.0000 mg | Freq: Four times a day (QID) | INTRAMUSCULAR | Status: DC | PRN
Start: 2023-04-28 — End: 2023-05-01

## 2023-04-28 MED ORDER — ALBUTEROL SULFATE (2.5 MG/3ML) 0.083% IN NEBU: 2.5000 mg | INHALATION_SOLUTION | RESPIRATORY_TRACT | Status: DC | PRN

## 2023-04-28 MED ORDER — MONTELUKAST SODIUM 10 MG PO TABS
10.0000 mg | ORAL_TABLET | Freq: Every day | ORAL | Status: DC
  Administered 2023-04-28 – 2023-05-01 (×4): 10 mg via ORAL
  Filled 2023-04-28 (×4): qty 1

## 2023-04-28 MED ORDER — ISOSORBIDE MONONITRATE ER 30 MG PO TB24
30.0000 mg | ORAL_TABLET | Freq: Every day | ORAL | Status: DC
  Administered 2023-04-28 – 2023-05-01 (×4): 30 mg via ORAL
  Filled 2023-04-28 (×4): qty 1

## 2023-04-28 MED ORDER — OSELTAMIVIR PHOSPHATE 30 MG PO CAPS
30.0000 mg | ORAL_CAPSULE | Freq: Two times a day (BID) | ORAL | Status: DC
  Administered 2023-04-28 – 2023-05-01 (×7): 30 mg via ORAL
  Filled 2023-04-28 (×7): qty 1

## 2023-04-28 MED ORDER — ACETAMINOPHEN 325 MG PO TABS
650.0000 mg | ORAL_TABLET | Freq: Four times a day (QID) | ORAL | Status: DC | PRN
  Administered 2023-04-28: 650 mg via ORAL
  Filled 2023-04-28: qty 2

## 2023-04-28 NOTE — Progress Notes (Signed)
 Patient kept removing herself from bipap. Patient stated she was breathing better.  Placed patient on 3lpm salter cannula with Sp02=98%. Nurse is aware of change and will continue to monitor patient.

## 2023-04-28 NOTE — Progress Notes (Signed)
 [facility] PROGRESS NOTE  Tami Zamora (DOB: 03-23-1965) UXL:[id number] PCP: Tami Curia, MD Outpatient Specialists: [facility]  Brief Narrative: Tami Zamora is a 58 y.o. female with a history of lung CA s/p lobectomy, CAD s/p PCI, 2L O2-dependent COPD/asthma, adrenal insufficiency who presented to the ED on 04/27/2023 with shortness of breath, noted to be lethargic and hypoxic worse than baseline. She was found to have respiratory acidosis, influenza A positive and left mid/base infiltrate on CXR. Placed on BiPAP, given steroids, antibiotics, tamiflu, and admitted this morning.   Subjective: Daughter at bedside, pt reports feeling much better, has amnesia for much of last night's events and did not tolerate BiPAP well. She has a HA but no chest pain. Breathing better. Sparse historian but able to tell me her medications and how she takes them.   Objective: BP 124/66   Pulse 73   Temp 98.1 F (36.7 C)   Resp 20   Ht 5' (1.524 m)   Wt 44.5 kg   SpO2 98%   BMI 19.14 kg/m   Gen: Chronically ill-appearing female in no acute distress Pulm: Diminished throughout, better air exchange at bases with slight rales on left. Early inspiratory squeak intermittently, no expiratory wheezing.   CV: RRR, no MRG or pitting edema GI: Soft, NT, ND, +BS Neuro: Alert and oriented. No new focal deficits. Ext: Warm, no deformities. Skin: No new rashes, lesions or ulcers on visualized skin   Assessment & Plan: Acute on chronic hypoxic and hypercarbic respiratory failure due to CAP, influenza A, and AECOPD on baseline asthma, COPD, lung CA s/p lobectomy:  - Continue ceftriaxone, azithromycin - Continue tamiflu, dosed for CrCl < 74ml/min - Continue solumedrol 40mg  IV q12h.  - Continue controlled inhaler formulary equivalent for trelegy (breo ellipta while on duoneb), and singulair.  - Continue scheduled duoneb and prn albuterol.  - D/w pt/family/RN > needs to add IS, FV - Sputum culture  if able - Was on BiPAP overnight but has improved significantly from both mental and respiratory standpoint. No longer clinically in respiratory acidosis. Bicarb on CMP elevated to 40 suggesting chronic hypercarbia as well.   Adrenal insufficiency:  - Confirmed hydrocortisone dosing (5mg  BID) as well as other medications this morning. Giving solumedrol as above, will continue this and wean to home dose as tolerated going forward.   CAD s/p PCI, HTN: No chest pain, troponins negative and BNP negative.  - Continue home aspirin, cardioselective BB, CCB, imdur.   Anxiety:  - Continue lowest dose prn albuterol  Headache:  - prn tylenol, continue home lyrica and celecoxib  Thrombocytopenia:  - Monitor, suspect this and leukopenia are due to viral infection.   Tami Nine, MD [facility] [email or web address] 04/28/2023, 10:32 AM

## 2023-04-28 NOTE — Evaluation (Signed)
 Physical Therapy Evaluation Patient Details Name: Tami Zamora MRN: [id number] DOB: 27-Feb-1966 Today's Date: 04/28/2023  History of Present Illness  Tami Zamora is a 58 y.o. female who presented to the ED on 04/27/2023 with shortness of breath, noted to be lethargic and hypoxic worse than baseline. Workup revealed respiratory acidosis, Flu A +, Lt mid/base lung infiltrate. PT treated w/ BiPAP overnight but has improved significantly from both mental and respiratory standpoint. PMH significant for lung CA s/p lobectomy, CAD s/p PCI, 2L O2-dependent COPD/asthma, adrenal insufficiency.   Clinical Impression  Tami Zamora is 58 y.o. female admitted with above HPI and diagnosis. Patient is currently limited by functional impairments below (see PT problem list). Patient lives alone and is independent with no device for mobility. at baseline. Currently pt limited by fatigue, SOB, and increased WOB with mobility. Pt was able to complete bed mobility without assist and stood to take small steps along EOB. Pt on 2L/min and sating at 95% at rest, desat to 85% and required 3L/min to recover to 90%. RN notified. Patient will benefit from continued skilled PT interventions to address impairments and progress independence with mobility, recommending HHPT and assist from family. Acute PT will follow and progress as able.         If plan is discharge home, recommend the following: A little help with walking and/or transfers;A little help with bathing/dressing/bathroom;Assistance with cooking/housework;Assist for transportation;Help with stairs or ramp for entrance   Can travel by private vehicle        Equipment Recommendations None recommended by PT (TBD if RW needed)  Recommendations for Other Services       Functional Status Assessment Patient has had a recent decline in their functional status and demonstrates the ability to make significant improvements in function in a reasonable and predictable  amount of time.     Precautions / Restrictions Precautions Precautions: Fall Recall of Precautions/Restrictions: Intact Precaution/Restrictions Comments: watch SpO2 Restrictions Weight Bearing Restrictions Per Provider Order: No      Mobility  Bed Mobility Overal bed mobility: Needs Assistance Bed Mobility: Supine to Sit, Sit to Supine     Supine to sit: Supervision Sit to supine: Supervision   General bed mobility comments: sup for safety on stretcher    Transfers Overall transfer level: Needs assistance Equipment used: None, 1 person hand held assist Transfers: Sit to/from Stand Sit to Stand: Contact guard assist           General transfer comment: CGA for safety with rise. HHA provided for pt as needed for light steadying assist.    Ambulation/Gait Ambulation/Gait assistance: Min assist, Contact guard assist Gait Distance (Feet): 5 Feet Assistive device: 1 person hand held assist, None Gait Pattern/deviations: Step-to pattern, Decreased stride length Gait velocity: decr     General Gait Details: slow shortened steps along EOB, pt fatiguing quickly and requesting return to bed. HHA at start and pt able to take steps with CGA only.  Stairs            Wheelchair Mobility     Tilt Bed    Modified Rankin (Stroke Patients Only)       Balance Overall balance assessment: Needs assistance Sitting-balance support: Feet supported Sitting balance-Leahy Scale: Good Sitting balance - Comments: donned shoes sitting EOB   Standing balance support: During functional activity Standing balance-Leahy Scale: Fair  Pertinent Vitals/Pain Pain Assessment Pain Assessment: Faces Faces Pain Scale: Hurts a little bit Pain Location: Lt hip Pain Descriptors / Indicators: Discomfort Pain Intervention(s): Limited activity within patient's tolerance, Monitored during session, Repositioned    Home Living Family/patient  expects to be discharged to:: Private residence Living Arrangements: Alone Available Help at Discharge: Family Type of Home: House Home Access: Stairs to enter Entrance Stairs-Rails: Left;Right;Can reach both Secretary/administrator of Steps: 4   Home Layout: One level Home Equipment: Shower seat;Grab bars - tub/shower;Wheelchair - manual (unsure of ambulation DME)      Prior Function Prior Level of Function : Independent/Modified Independent             Mobility Comments: in with no AD ADLs Comments: ind with dressing/bathing. does meals on wheels. (dtr brings meals). dtr does household tasks.     Extremity/Trunk Assessment   Upper Extremity Assessment Upper Extremity Assessment: Overall WFL for tasks assessed;Defer to OT evaluation    Lower Extremity Assessment Lower Extremity Assessment: Overall WFL for tasks assessed (grossly 4/5)    Cervical / Trunk Assessment Cervical / Trunk Assessment: Kyphotic  Communication   Communication Communication: No apparent difficulties    Cognition Arousal: Alert Behavior During Therapy: WFL for tasks assessed/performed   PT - Cognitive impairments: No apparent impairments                         Following commands: Intact       Cueing Cueing Techniques: Verbal cues     General Comments      Exercises     Assessment/Plan    PT Assessment Patient needs continued PT services  PT Problem List Decreased strength;Decreased activity tolerance;Decreased balance;Decreased mobility;Decreased knowledge of use of DME;Decreased safety awareness;Cardiopulmonary status limiting activity       PT Treatment Interventions DME instruction;Gait training;Stair training;Functional mobility training;Therapeutic activities;Therapeutic exercise;Balance training;Neuromuscular re-education;Patient/family education    PT Goals (Current goals can be found in the Care Plan section)  Acute Rehab PT Goals Patient Stated Goal: recover  strength, breathing, and return home PT Goal Formulation: With patient/family Time For Goal Achievement: 05/12/23 Potential to Achieve Goals: Good    Frequency Min 1X/week     Co-evaluation               AM-PAC PT "6 Clicks" Mobility  Outcome Measure Help needed turning from your back to your side while in a flat bed without using bedrails?: A Little Help needed moving from lying on your back to sitting on the side of a flat bed without using bedrails?: A Little Help needed moving to and from a bed to a chair (including a wheelchair)?: A Little Help needed standing up from a chair using your arms (e.g., wheelchair or bedside chair)?: A Little Help needed to walk in hospital room?: A Little Help needed climbing 3-5 steps with a railing? : A Lot 6 Click Score: 17    End of Session Equipment Utilized During Treatment: Gait belt Activity Tolerance: Patient tolerated treatment well Patient left: in bed;with call bell/phone within reach;with family/visitor present Nurse Communication: Mobility status PT Visit Diagnosis: Other abnormalities of gait and mobility (R26.89);Muscle weakness (generalized) (M62.81);Difficulty in walking, not elsewhere classified (R26.2)    Time: 6213-0865 PT Time Calculation (min) (ACUTE ONLY): 24 min   Charges:   PT Evaluation $PT Eval Low Complexity: 1 Low PT Treatments $Therapeutic Activity: 8-22 mins PT General Charges $$ ACUTE PT VISIT: 1 Visit  Wynn Maudlin, DPT Acute Rehabilitation Services Office [phone]  04/28/23 1:48 PM

## 2023-04-28 NOTE — Progress Notes (Signed)
 ABG results given to MD.  Starting BiPAP per order.

## 2023-04-28 NOTE — ED Notes (Signed)
 Report given to [facility]

## 2023-04-28 NOTE — H&P (Addendum)
 History and Physical    Patient: Tami Zamora NWG:[id number] DOB: [birth year] DOA: 04/27/2023 DOS: the patient was seen and examined on 04/28/2023 PCP: [name], MD  Patient coming from: Home  Chief Complaint:  Chief Complaint  Patient presents with   Code STEMI   HPI: Tami Zamora is a 58 y.o. female with medical history significant for lung cancer status post lobectomy, COPD on chronic 2 L O2 nasal cannula, coronary artery disease with stent placed 1 year ago, and hypertension who presents with cough, chills, and increased shortness of breath.  My history comes entirely from the emergency department physician as the patient is too drowsy to give a reliable history.  In the emergency department the patient required 6 L O2 to keep her sats in the 90s.  She initially went through a code STEMI because of chest pain on presentation.  The patient does have history of a cardiac stent about a year ago.  Her troponins were negative and her chest pain was more reproducible so the code STEMI was canceled. She did have a cough and was found to be influenza A positive.  Review of Systems: unable to review all systems due to the inability of the patient to answer questions. Past Medical History:  Diagnosis Date   Acute coronary syndrome with high troponin (HCC) 05/2018   [facility] - Cath - 1 V CAD - DES PCI OM1   Adenocarcinoma of lung, stage 1 (HCC) 11/28/2007   2009 Left upper lobectomy:  1. LUNG, LEFT UPPER LOBE, SEGMENTAL RESECTION:  - INVASIVE MODERATELY DIFFERENTIATED ADENOCARCINOMA, 2.0  CM, WITH FOCAL VISCERAL  PLEURAL INVOLVEMENT.  - NO ANGIOLYMPHATIC INVASION IDENTIFIED.  - RESECTION MARGIN IS NEGATIVE FOR NEOPLASM.  - FOUR HILAR LYMPH NODES, NEGATIVE FOR METASTATIC CARCINOMA   Overview:  Overview:  2009 Left upper lobectomy:  1. LUNG, LEFT UPPER   Aortic regurgitation 07/21/2018   Mild on TTE March 2020   Bipolar affective disorder (HCC)    CAD S/P DES PCI - OM1 05/2018   75% OM1 - DES PCI  - Promus Elite DES 2.5 x 16 (post-dilated to ~2.6 mm)   Centrilobular emphysema, COPD 01/05/2008   May 2016 simple spirometry FEV1 0.87 L (36% predicted)   Overview:  Overview:  May 2016 simple spirometry FEV1 0.87 L (36% predicted)  Last Assessment & Plan:  COPD: GOLD Grade C Combined recommendations from the [employer], [employer], Designer, television/film set, European Respiratory Society (Qaseem A et al, Dewayne Hatch Intern Med. 2011;155(3):179) recommends    Chest pain 05/19/2018   Chronic hypoxemic respiratory failure (HCC) 07/26/2014   2L qHS and with exertoin  Overview:  Overview:  2L qHS and with exertoin  Last Assessment & Plan:  We will start 2 L daily at bedtime and with exertion. Portable oxygen concentrator ordered.   Chronic obstructive pulmonary disease with acute exacerbation (HCC) 12/08/2014   COPD (chronic obstructive pulmonary disease) (HCC)    Coronary artery disease of native artery of native heart with stable angina pectoris (HCC) 05/19/2018   Dizzy 05/19/2018   History of ST elevation myocardial infarction (STEMI) 05/19/2018   Hyperlipidemia    Hypertension    Hypothyroidism    Major depressive disorder without psychotic features 12/08/2014   Numbness in feet 05/19/2018   Pain in both lower extremities 04/14/2019   Progressive angina (HCC) - ~ Class III 04/20/2019   Tobacco abuse 07/26/2014   Failed Chantix "made my bipolar worse"  Overview:  Overview:  Failed Chantix "made my bipolar worse"  Last Assessment & Plan:  Advised at length to quit. She failed Chantix.  She prefers to try to quit cold [place].   Past Surgical History:  Procedure Laterality Date   ABDOMINAL HYSTERECTOMY     CORONARY STENT INTERVENTION  05/10/2018   [facility]: 75 % OM1 - 0%: Promus Elite DES 2.5 x 16 (2.6 mm)   INCONTINENCE SURGERY     LEFT HEART CATH AND CORONARY ANGIOGRAPHY  05/10/2018   [facility]: R dominant. Mostly normal except 75% OM1 -- DES PCI   LEFT HEART CATH AND  CORONARY ANGIOGRAPHY N/A 04/20/2019   Procedure: LEFT HEART CATH AND CORONARY ANGIOGRAPHY;  Surgeon: Marykay Lex, MD;  Location: [facility] INVASIVE CV LAB;  Service: Cardiovascular;  Laterality: N/A;   LUNG REMOVAL, PARTIAL     2009   TRANSTHORACIC ECHOCARDIOGRAM  05/11/2018   [facility]: post MI --> EF 55-60%, no RWMA. Mild AI. Otherwise normal.    Social History:  reports that she has been smoking cigarettes. She has a 36 pack-year smoking history. She has never used smokeless tobacco. She reports current alcohol use. She reports that she does not currently use drugs.  Allergies  Allergen Reactions   Morphine Hives and Rash   Morphine And Codeine    Penicillins Rash    Did it involve swelling of the face/tongue/throat, SOB, or low BP? No Did it involve sudden or severe rash/hives, skin peeling, or any reaction on the inside of your mouth or nose? No Did you need to seek medical attention at a hospital or doctor's office? No When did it last happen?      15 years If all above answers are "NO", may proceed with cephalosporin use.    Family History  Problem Relation Age of Onset   Hypertension Paternal Uncle    Emphysema Mother    Heart attack Mother    Emphysema Maternal Aunt    Heart disease Cousin    Heart disease Paternal Grandfather    Cancer Father        liver    Prior to Admission medications   Medication Sig Start Date End Date Taking? Authorizing Provider  albuterol (PROVENTIL) (2.5 MG/3ML) 0.083% nebulizer solution USE 1 VIAL IN NEBULIZER 4 TIMES DAILY 03/25/23  Yes [provider]  DULoxetine (CYMBALTA) 30 MG capsule Take 30 mg by mouth 2 (two) times daily. 04/02/23  Yes [provider]  hydrocortisone (CORTEF) 5 MG tablet Take 5 mg by mouth 2 (two) times daily. 03/15/23  Yes [provider]  ipratropium-albuterol (DUONEB) 0.5-2.5 (3) MG/3ML SOLN USE 3 ML IN NEBULIZER EVERY 6 TO 8 HOURS AS NEEDED FOR WHEEZING 03/11/23  Yes [provider]   metoprolol succinate (TOPROL-XL) 25 MG 24 hr tablet Take 25 mg by mouth daily. 03/18/23  Yes [provider]  montelukast (SINGULAIR) 10 MG tablet Take 10 mg by mouth daily. 02/22/23  Yes [provider]  sulfamethoxazole-trimethoprim (BACTRIM DS) 800-160 MG tablet Take 1 tablet by mouth 2 (two) times daily. 04/22/23  Yes [provider]  TRELEGY ELLIPTA 100-62.5-25 MCG/ACT AEPB Inhale 1 puff into the lungs daily. 04/08/23  Yes [provider]  albuterol (VENTOLIN HFA) 108 (90 Base) MCG/ACT inhaler Inhale 2 puffs into the lungs every 4 (four) hours as needed for wheezing or shortness of breath.     [provider]  ALPRAZolam Prudy Feeler) 0.25 MG tablet Take 0.25 mg by mouth 2 (two) times daily.  [provider]  aspirin 81 MG chewable tablet Chew 81 mg by mouth daily.    [provider]  atorvastatin (LIPITOR) 80 MG tablet Take 80 mg by mouth daily at 6 PM.  05/12/18   [provider]  diltiazem (CARDIZEM CD) 240 MG 24 hr capsule Take 1 capsule (240 mg total) by mouth daily. 04/14/19 07/13/19  Tobb, Kardie, DO  doxycycline (VIBRA-TABS) 100 MG tablet Take 100 mg by mouth 2 (two) times daily. 05/11/19   [provider]  isosorbide mononitrate (IMDUR) 30 MG 24 hr tablet **NO REFILLS UNTIL SEEN IN OFFICE** 04/14/19   Tobb, Kardie, DO  magnesium oxide (MAG-OX) 400 MG tablet Take by mouth.    [provider]  nitroGLYCERIN (NITROSTAT) 0.4 MG SL tablet Place 1 tablet (0.4 mg total) under the tongue every 5 (five) minutes as needed for chest pain. 09/09/18 05/22/19  Baldo Daub, MD  omeprazole (PRILOSEC) 40 MG capsule Take 40 mg by mouth daily. 05/06/18   [provider]  OXYGEN Inhale 2 L into the lungs daily as needed (oxygen level below 90).    [provider]  pregabalin (LYRICA) 100 MG capsule Take 100 mg by mouth daily.  05/02/18   [provider]  valACYclovir (VALTREX) 500 MG tablet Take 500 mg  by mouth daily.    [provider]    Physical Exam: Vitals:   04/28/23 0030 04/28/23 0100 04/28/23 0115 04/28/23 0125  BP: 120/70 125/69 119/64   Pulse: 69 69 69   Resp: 17 (!) 22 (!) 22   Temp:    98.2 F (36.8 C)  TempSrc:    Oral  SpO2: 100% 99% 99%   Weight:      Height:       Physical Exam:  General: Difficult to arouse, pursed lipped breathing, malnourished HEENT: Normocephalic, atraumatic, PERRL Cardiovascular: Normal rate and rhythm. Distal pulses intact. Pulmonary: mild exp wheeze posteriorly Gastrointestinal: Nondistended, soft, non-tender, normoactive bowel sounds Musculoskeletal:No lower ext edema Skin: Skin is warm and dry. Neuro: No focal deficits noted, AAOx3. PSYCH: lethargic.  She is able to briefly follow commands  Data Reviewed:  Results for orders placed or performed during the hospital encounter of 04/27/23 (from the past 24 hours)  Basic metabolic panel     Status: Abnormal   Collection Time: 04/27/23  6:49 PM  Result Value Ref Range   Sodium 135 135 - 145 mmol/L   Potassium 4.9 3.5 - 5.1 mmol/L   Chloride 87 (L) 98 - 111 mmol/L   CO2 40 (H) 22 - 32 mmol/L   Glucose, Bld 108 (H) 70 - 99 mg/dL   BUN 17 6 - 20 mg/dL   Creatinine, Ser 6.21 0.44 - 1.00 mg/dL   Calcium 8.4 (L) 8.9 - 10.3 mg/dL   GFR, Estimated >30 >86 mL/min   Anion gap 8 5 - 15  CBC     Status: Abnormal   Collection Time: 04/27/23  6:49 PM  Result Value Ref Range   WBC 3.5 (L) 4.0 - 10.5 K/uL   RBC 4.14 3.87 - 5.11 MIL/uL   Hemoglobin 12.9 12.0 - 15.0 g/dL   HCT 57.8 46.9 - 62.9 %   MCV 102.4 (H) 80.0 - 100.0 fL   MCH 31.2 26.0 - 34.0 pg   MCHC 30.4 30.0 - 36.0 g/dL   RDW 52.8 41.3 - 24.4 %   Platelets 80 (L) 150 - 400 K/uL   nRBC 0.0 0.0 - 0.2 %  Troponin I (High Sensitivity)     Status: None   Collection Time: 04/27/23  6:49 PM  Result Value Ref Range   Troponin I (High Sensitivity) 3 <18 ng/L  Hepatic function panel     Status: Abnormal   Collection Time:  04/27/23  6:49 PM  Result Value Ref Range   Total Protein 5.8 (L) 6.5 - 8.1 g/dL   Albumin 2.7 (L) 3.5 - 5.0 g/dL   AST 32 15 - 41 U/L   ALT 10 0 - 44 U/L   Alkaline Phosphatase 57 38 - 126 U/L   Total Bilirubin 0.7 0.0 - 1.2 mg/dL   Bilirubin, Direct 0.4 (H) 0.0 - 0.2 mg/dL   Indirect Bilirubin 0.3 0.3 - 0.9 mg/dL  Lipase, blood     Status: None   Collection Time: 04/27/23  6:49 PM  Result Value Ref Range   Lipase 28 11 - 51 U/L  Brain natriuretic peptide     Status: None   Collection Time: 04/27/23  6:57 PM  Result Value Ref Range   B Natriuretic Peptide 39.2 0.0 - 100.0 pg/mL  Resp panel by RT-PCR (RSV, Flu A&B, Covid) Anterior Nasal Swab     Status: Abnormal   Collection Time: 04/27/23  8:11 PM   Specimen: Anterior Nasal Swab  Result Value Ref Range   SARS Coronavirus 2 by RT PCR NEGATIVE NEGATIVE   Influenza A by PCR POSITIVE (A) NEGATIVE   Influenza B by PCR NEGATIVE NEGATIVE   Resp Syncytial Virus by PCR NEGATIVE NEGATIVE  Troponin I (High Sensitivity)     Status: None   Collection Time: 04/27/23  9:47 PM  Result Value Ref Range   Troponin I (High Sensitivity) 3 <18 ng/L     Assessment and Plan: Acute hypercarbic respiratory failure / Influenza A-  Bilateral Infiltrates  - Will start bipap / Progressive level of care - Rocephin and Zithromax empirically - Nebs, steroids - Tamiflu    Advance Care Planning:   Code Status: Prior the patient is unable to participate in discussion about CODE STATUS.  She will be full code for now by default.  Consults: none  Family Communication: none Severity of Illness: The appropriate patient status for this patient is INPATIENT. Inpatient status is judged to be reasonable and necessary in order to provide the required intensity of service to ensure the patient's safety. The patient's presenting symptoms, physical exam findings, and initial radiographic and laboratory data in the context of their chronic comorbidities is felt to  place them at high risk for further clinical deterioration. Furthermore, it is not anticipated that the patient will be medically stable for discharge from the hospital within 2 midnights of admission.   * I certify that at the point of admission it is my clinical judgment that the patient will require inpatient hospital care spanning beyond 2 midnights from the point of admission due to high intensity of service, high risk for further deterioration and high frequency of surveillance required.*  Author: Buena Irish, MD 04/28/2023 1:27 AM  For on call review [email or web address].

## 2023-04-28 NOTE — ED Notes (Signed)
 Called ccmd verified pt is on the monitor

## 2023-04-29 DIAGNOSIS — J432 Centrilobular emphysema: Secondary | ICD-10-CM | POA: Diagnosis not present

## 2023-04-29 DIAGNOSIS — J9611 Chronic respiratory failure with hypoxia: Secondary | ICD-10-CM | POA: Diagnosis not present

## 2023-04-29 DIAGNOSIS — J9691 Respiratory failure, unspecified with hypoxia: Secondary | ICD-10-CM | POA: Diagnosis not present

## 2023-04-29 DIAGNOSIS — E43 Unspecified severe protein-calorie malnutrition: Secondary | ICD-10-CM | POA: Insufficient documentation

## 2023-04-29 DIAGNOSIS — J9601 Acute respiratory failure with hypoxia: Secondary | ICD-10-CM | POA: Diagnosis not present

## 2023-04-29 LAB — CBC
HCT: 34.7 % — ABNORMAL LOW (ref 36.0–46.0)
Hemoglobin: 10.8 g/dL — ABNORMAL LOW (ref 12.0–15.0)
MCH: 31.3 pg (ref 26.0–34.0)
MCHC: 31.1 g/dL (ref 30.0–36.0)
MCV: 100.6 fL — ABNORMAL HIGH (ref 80.0–100.0)
Platelets: 141 K/uL — ABNORMAL LOW (ref 150–400)
RBC: 3.45 MIL/uL — ABNORMAL LOW (ref 3.87–5.11)
RDW: 12.7 % (ref 11.5–15.5)
WBC: 3.5 K/uL — ABNORMAL LOW (ref 4.0–10.5)
nRBC: 0 % (ref 0.0–0.2)

## 2023-04-29 LAB — BASIC METABOLIC PANEL
Anion gap: 9 (ref 5–15)
BUN: 28 mg/dL — ABNORMAL HIGH (ref 6–20)
CO2: 43 mmol/L — ABNORMAL HIGH (ref 22–32)
Calcium: 9.1 mg/dL (ref 8.9–10.3)
Chloride: 90 mmol/L — ABNORMAL LOW (ref 98–111)
Creatinine, Ser: 0.46 mg/dL (ref 0.44–1.00)
GFR, Estimated: >60 mL/min (ref >60)
Glucose, Bld: 124 mg/dL — ABNORMAL HIGH (ref 70–99)
Potassium: 5.2 mmol/L — ABNORMAL HIGH (ref 3.5–5.1)
Sodium: 142 mmol/L (ref 135–145)

## 2023-04-29 MED ORDER — BENZONATATE 100 MG PO CAPS: 200.0000 mg | ORAL_CAPSULE | Freq: Three times a day (TID) | ORAL | Status: DC | PRN

## 2023-04-29 MED ORDER — GUAIFENESIN ER 600 MG PO TB12
600.0000 mg | ORAL_TABLET | Freq: Two times a day (BID) | ORAL | Status: DC
  Administered 2023-04-29 – 2023-05-01 (×5): 600 mg via ORAL
  Filled 2023-04-29 (×5): qty 1

## 2023-04-29 MED ORDER — ADULT MULTIVITAMIN W/MINERALS CH
1.0000 | ORAL_TABLET | Freq: Every day | ORAL | Status: DC
  Administered 2023-04-29 – 2023-05-01 (×3): 1 via ORAL
  Filled 2023-04-29 (×3): qty 1

## 2023-04-29 MED ORDER — SODIUM ZIRCONIUM CYCLOSILICATE 10 G PO PACK
10.0000 g | PACK | Freq: Every day | ORAL | Status: AC
  Administered 2023-04-29: 10 g via ORAL
  Filled 2023-04-29: qty 1

## 2023-04-29 MED ORDER — AZITHROMYCIN 500 MG PO TABS
500.0000 mg | ORAL_TABLET | Freq: Once | ORAL | Status: AC
  Administered 2023-04-29: 500 mg via ORAL
  Filled 2023-04-29: qty 1

## 2023-04-29 MED ORDER — ENSURE ENLIVE PO LIQD
237.0000 mL | Freq: Two times a day (BID) | ORAL | Status: DC
  Administered 2023-04-29 – 2023-05-01 (×4): 237 mL via ORAL

## 2023-04-29 NOTE — Progress Notes (Signed)
   04/29/23 0039  BiPAP/CPAP/SIPAP  $ Non-Invasive Ventilator  Non-Invasive Vent Subsequent  BiPAP/CPAP/SIPAP Pt Type Adult  BiPAP/CPAP/SIPAP SERVO  Mask Type Full face mask  Dentures removed? Not applicable  Mask Size Small  Set Rate 15 breaths/min  Respiratory Rate 20 breaths/min  IPAP 15 cmH20  EPAP 5 cmH2O  Pressure Support 10 cmH20  FiO2 (%) 40 %  Minute Ventilation 10.6  Leak 71  Peak Inspiratory Pressure (PIP) 14  Tidal Volume (Vt) 432  Patient Home Equipment No  Auto Titrate No  CPAP/SIPAP surface wiped down Yes   Pt tolerating bipap and resting comfortably at this time.  RT will continure to monitor

## 2023-04-29 NOTE — Progress Notes (Addendum)
 PROGRESS NOTE        PATIENT DETAILS Name: Tami Zamora Age: 58 y.o. Sex: female Date of Birth: [birth year] Admit Date: 04/27/2023 Admitting Physician Buena Irish, MD PCP:Lee, Marquette Saa, MD  Brief Summary: Patient is a 58 y.o.  female with history of COPD on home O2 2 L, adenocarcinoma of lung-s/p left upper lobe segmental resection in 2009, adrenal insufficiency-presented with cough/shortness of breath-found to have acute on chronic hypoxic/hypercarbic respiratory failure secondary to COPD exacerbation in the setting of influenza A PNA.  She initially required BiPAP-but rapidly improved with steroids/antimicrobial therapy/bronchodilators.  See below for further details.  Significant events: 2/25>> admit to [facility]-hypoxia-COPD exacerbation/influenza-BiPAP  Significant studies: 2/25>> CXR: Mid left lung/bibasilar infiltrates.  Significant microbiology data: 2/25>> influenza A PCR: Positive 2/25>> COVID/influenza B/RSV PCR: Negative  Procedures: None  Consults: None  Subjective: Still coughing-feels better-on 2 L of oxygen this morning.  Objective: Vitals: Blood pressure 136/85, pulse 95, temperature 99.2 F (37.3 C), temperature source Oral, resp. rate 18, height 5' (1.524 m), weight 48.2 kg, SpO2 90%.   Exam: Gen Exam:Alert awake-not in any distress HEENT:atraumatic, normocephalic Chest: Diminished air entry at bases-but moving air-some scattered rhonchi. CVS:S1S2 regular Abdomen:soft non tender, non distended Extremities:no edema Neurology: Non focal Skin: no rash  Pertinent Labs/Radiology:    Latest Ref Rng & Units 04/29/2023    7:40 AM 04/27/2023    6:49 PM 04/14/2019    2:38 PM  CBC  WBC 4.0 - 10.5 K/uL 3.5  3.5  9.9   Hemoglobin 12.0 - 15.0 g/dL 78.2  95.6  21.3   Hematocrit 36.0 - 46.0 % 34.7  42.4  44.3   Platelets 150 - 400 K/uL 141  80  229     Lab Results  Component Value Date   NA 142 04/29/2023   K 5.2 (H) 04/29/2023    CL 90 (L) 04/29/2023   CO2 43 (H) 04/29/2023      Assessment/Plan: Acute on chronic hypoxic/hypercarbic respiratory failure secondary to COPD exacerbation due to influenza A PNA Improved-stable on 2 L of oxygen [place] stable-awake/alert-answering questions appropriately Still wheezing but moving air well Continue IV steroids/bronchodilators Continue Tamiflu Since procalcitonin negative-stop Rocephin-Zithromax x 3 days total.  Incentive spirometry/flutter valve Mobilize with nursing staff/PT/OT.  Adrenal insufficiency Currently on IV Solu-Medrol-will need to be slowly transitioned back to her dosing of 5 Mg of hydrocortisone in the next several days.  Mild leukopenia/thrombocytopenia Suspect secondary to viral syndrome/influenza Follow CBC periodically.  CAD s/p PCI No anginal symptoms Continue aspirin/beta-blocker/Imdur  Hyperkalemia Mild Unclear etiology Lokelma x 1 dose Repeat electrolytes tomorrow  History of adenocarcinoma of the left lung-s/p left upper lobe lobectomy.  Nutrition Status: Nutrition Problem: Severe Malnutrition Etiology: chronic illness Signs/Symptoms: severe muscle depletion, severe fat depletion Interventions: Ensure Enlive (each supplement provides 350kcal and 20 grams of protein), MVI, Hormel Shake    Code status:   Code Status: Full Code   DVT Prophylaxis: enoxaparin (LOVENOX) injection 30 mg Start: 04/28/23 1000   Family Communication: Daughter at bedside   Disposition Plan: Status is: Inpatient Remains inpatient appropriate because: Severity of illness   Planned Discharge Destination:Home health   Diet: Diet Order             Diet regular Room service appropriate? Yes; Fluid consistency: Thin  Diet effective now  Antimicrobial agents: Anti-infectives (From admission, onward)    Start     Dose/Rate Route Frequency Ordered Stop   04/28/23 2200  cefTRIAXone (ROCEPHIN) 1 g in sodium chloride 0.9 %  100 mL IVPB        1 g 200 mL/hr over 30 Minutes Intravenous Every 24 hours 04/28/23 0239     04/28/23 2200  azithromycin (ZITHROMAX) 500 mg in sodium chloride 0.9 % 250 mL IVPB        500 mg 250 mL/hr over 60 Minutes Intravenous Every 24 hours 04/28/23 0239     04/28/23 1045  valACYclovir (VALTREX) tablet 500 mg        500 mg Oral Daily 04/28/23 1036     04/28/23 0445  oseltamivir (TAMIFLU) capsule 30 mg        30 mg Oral 2 times daily 04/28/23 0355 05/03/23 0959   04/27/23 2100  cefTRIAXone (ROCEPHIN) 1 g in sodium chloride 0.9 % 100 mL IVPB        1 g 200 mL/hr over 30 Minutes Intravenous  Once 04/27/23 2056 04/27/23 2217   04/27/23 2100  azithromycin (ZITHROMAX) 500 mg in sodium chloride 0.9 % 250 mL IVPB        500 mg 250 mL/hr over 60 Minutes Intravenous  Once 04/27/23 2056 04/28/23 0121        MEDICATIONS: Scheduled Meds:  aspirin  81 mg Oral Daily   DULoxetine  30 mg Oral BID   enoxaparin (LOVENOX) injection  30 mg Subcutaneous Q24H   fluticasone furoate-vilanterol  1 puff Inhalation Daily   guaiFENesin  600 mg Oral BID   ipratropium-albuterol  3 mL Nebulization Q6H   isosorbide mononitrate  30 mg Oral Daily   methylPREDNISolone (SOLU-MEDROL) injection  40 mg Intravenous Q12H   metoprolol succinate  25 mg Oral Daily   montelukast  10 mg Oral Daily   oseltamivir  30 mg Oral BID   pregabalin  100 mg Oral BID   valACYclovir  500 mg Oral Daily   Continuous Infusions:  azithromycin 500 mg (04/28/23 2205)   cefTRIAXone (ROCEPHIN)  IV 1 g (04/28/23 2323)   PRN Meds:.acetaminophen **OR** acetaminophen, albuterol, ALPRAZolam, benzonatate, bisacodyl, ondansetron **OR** ondansetron (ZOFRAN) IV   I have personally reviewed following labs and imaging studies  LABORATORY DATA: CBC: Recent Labs  Lab 04/27/23 1849 04/29/23 0740  WBC 3.5* 3.5*  HGB 12.9 10.8*  HCT 42.4 34.7*  MCV 102.4* 100.6*  PLT 80* 141*    Basic Metabolic Panel: Recent Labs  Lab 04/27/23 1849  04/29/23 0740  NA 135 142  K 4.9 5.2*  CL 87* 90*  CO2 40* 43*  GLUCOSE 108* 124*  BUN 17 28*  CREATININE 0.69 0.46  CALCIUM 8.4* 9.1    GFR: Estimated Creatinine Clearance: 55.7 mL/min (by C-G formula based on SCr of 0.46 mg/dL).  Liver Function Tests: Recent Labs  Lab 04/27/23 1849  AST 32  ALT 10  ALKPHOS 57  BILITOT 0.7  PROT 5.8*  ALBUMIN 2.7*   Recent Labs  Lab 04/27/23 1849  LIPASE 28   No results for input(s): "AMMONIA" in the last 168 hours.  Coagulation Profile: No results for input(s): "INR", "PROTIME" in the last 168 hours.  Cardiac Enzymes: No results for input(s): "CKTOTAL", "CKMB", "CKMBINDEX", "TROPONINI" in the last 168 hours.  BNP (last 3 results) No results for input(s): "PROBNP" in the last 8760 hours.  Lipid Profile: No results for input(s): "CHOL", "HDL", "LDLCALC", "TRIG", "CHOLHDL", "LDLDIRECT" in the  last 72 hours.  Thyroid Function Tests: No results for input(s): "TSH", "T4TOTAL", "FREET4", "T3FREE", "THYROIDAB" in the last 72 hours.  Anemia Panel: No results for input(s): "VITAMINB12", "FOLATE", "FERRITIN", "TIBC", "IRON", "RETICCTPCT" in the last 72 hours.  Urine analysis:    Component Value Date/Time   COLORURINE AMBER BIOCHEMICALS MAY BE AFFECTED BY COLOR (A) 12/15/2007 1135   APPEARANCEUR CLOUDY (A) 12/15/2007 1135   LABSPEC 1.031 (H) 12/15/2007 1135   PHURINE 6.0 12/15/2007 1135   GLUCOSEU NEGATIVE 12/15/2007 1135   HGBUR NEGATIVE 12/15/2007 1135   BILIRUBINUR NEGATIVE 12/15/2007 1135   KETONESUR 15 (A) 12/15/2007 1135   PROTEINUR 30 (A) 12/15/2007 1135   UROBILINOGEN 1.0 12/15/2007 1135   NITRITE NEGATIVE 12/15/2007 1135   LEUKOCYTESUR NEGATIVE 12/15/2007 1135    Sepsis Labs: Lactic Acid, Venous No results found for: "LATICACIDVEN"  MICROBIOLOGY: Recent Results (from the past 240 hours)  Resp panel by RT-PCR (RSV, Flu A&B, Covid) Anterior Nasal Swab     Status: Abnormal   Collection Time: 04/27/23  8:11 PM    Specimen: Anterior Nasal Swab  Result Value Ref Range Status   SARS Coronavirus 2 by RT PCR NEGATIVE NEGATIVE Final   Influenza A by PCR POSITIVE (A) NEGATIVE Final   Influenza B by PCR NEGATIVE NEGATIVE Final    Comment: (NOTE) The Xpert Xpress SARS-CoV-2/FLU/RSV plus assay is intended as an aid in the diagnosis of influenza from Nasopharyngeal swab specimens and should not be used as a sole basis for treatment. Nasal washings and aspirates are unacceptable for Xpert Xpress SARS-CoV-2/FLU/RSV testing.  Fact Sheet for Patients: BloggerCourse.com  Fact Sheet for Healthcare Providers: SeriousBroker.it  This test is not yet approved or cleared by the [place] FDA and has been authorized for detection and/or diagnosis of SARS-CoV-2 by FDA under an Emergency Use Authorization (EUA). This EUA will remain in effect (meaning this test can be used) for the duration of the COVID-19 declaration under Section 564(b)(1) of the Act, 21 U.S.C. section 360bbb-3(b)(1), unless the authorization is terminated or revoked.     Resp Syncytial Virus by PCR NEGATIVE NEGATIVE Final    Comment: (NOTE) Fact Sheet for Patients: BloggerCourse.com  Fact Sheet for Healthcare Providers: SeriousBroker.it  This test is not yet approved or cleared by the [place] FDA and has been authorized for detection and/or diagnosis of SARS-CoV-2 by FDA under an Emergency Use Authorization (EUA). This EUA will remain in effect (meaning this test can be used) for the duration of the COVID-19 declaration under Section 564(b)(1) of the Act, 21 U.S.C. section 360bbb-3(b)(1), unless the authorization is terminated or revoked.  Performed at [facility], [address], [place], [place] [address]     RADIOLOGY STUDIES/RESULTS: DG Chest 2 View Result Date: 04/27/2023 CLINICAL DATA:  Chest pain and shortness  of breath. EXAM: CHEST - 2 VIEW COMPARISON:  May 19, 2019 FINDINGS: The heart size and mediastinal contours are within normal limits. Mild, chronic appearing increased interstitial lung markings are seen. Surgical sutures and surgical clips are present overlying the left hilar region. Mild infiltrates are noted within the mid left lung and bilateral lung bases. No pleural effusion or pneumothorax is identified. The visualized skeletal structures are unremarkable. IMPRESSION: Mild mid left lung and bibasilar infiltrates. Electronically Signed   By: Aram Candela M.D.   On: 04/27/2023 19:31     LOS: 2 days   Jeoffrey Massed, MD  [facility]    To contact the attending provider between 7A-7P or  the covering provider during after hours 7P-7A, please log into the web site [email or web address] and access using universal [place] password for that web site. If you do not have the password, please call the hospital operator.  04/29/2023, 10:09 AM

## 2023-04-29 NOTE — TOC Progression Note (Incomplete)
 Transition of Care [facility]) - Progression Note    Patient Details  Name: Tami Zamora MRN: [id number] Date of Birth: 06-Feb-1966  Transition of Care [facility]) CM/SW Contact  Gordy Clement, RN Phone Number: 04/29/2023, 3:30 PM  Clinical Narrative:   Patient from home  On 2L O2 [employer] at Baseline  Higher requirement this admission. RNCM spoke with patient and Daughter- Daughter has portable O2 in her car for transport home.  Home Health PT has been recommended and will be provided by [facility]  A rollator has been requested from [facility] and will be delivered bedside prior to discharge   TOC will continue to follow patient for any additional discharge needs               Expected Discharge Plan and Services       Living arrangements for the past 2 months: Single Family Home                                       Social Determinants of Health (SDOH) Interventions SDOH Screenings   Food Insecurity: Food Insecurity Present (04/28/2023)  Housing: Low Risk  (04/28/2023)  Transportation Needs: No Transportation Needs (04/28/2023)  Utilities: Not At Risk (04/28/2023)  Tobacco Use: High Risk (04/27/2023)    Readmission Risk Interventions     No data to display

## 2023-04-29 NOTE — Evaluation (Signed)
 Occupational Therapy Evaluation Patient Details Name: Tami Zamora MRN: [id number] DOB: 1965/06/09 Today's Date: 04/29/2023   History of Present Illness   Tami Zamora is a 58 y.o. female who presented to the ED on 04/27/2023 with shortness of breath, noted to be lethargic and hypoxic worse than baseline. Workup revealed respiratory acidosis, Flu A +, Lt mid/base lung infiltrate. PT treated w/ BiPAP overnight but has improved significantly from both mental and respiratory standpoint. PMH significant for lung CA s/p lobectomy, CAD s/p PCI, 2L O2-dependent COPD/asthma, adrenal insufficiency.     Clinical Impressions Pt admitted for above, PTA pt reports being independent in ADLs and ambulating no AD, prior notes indicate that her daughter provided meals and cleaned up the home but pt stated her daughter moved to [place] so unsure of that. Pt declined ambulation but stood EOB with CGA, she continues to desat with little activity. Pt would benefit from reinforcement on ocapella and incentive spirometer use. OT to continue following pt acutely to determine any further DME needs and educate her on energy conservation, Patient would benefit from post acute Home OT services to help maximize functional independence in natural environment      If plan is discharge home, recommend the following:   Assistance with cooking/housework     Functional Status Assessment   Patient has had a recent decline in their functional status and demonstrates the ability to make significant improvements in function in a reasonable and predictable amount of time.     Equipment Recommendations   Other (comment) (TBD pending further OOB mobility. May need RW or Rollator)     Recommendations for Other Services         Precautions/Restrictions   Precautions Precautions: Fall Recall of Precautions/Restrictions: Intact Precaution/Restrictions Comments: watch SpO2 Restrictions Weight Bearing  Restrictions Per Provider Order: No     Mobility Bed Mobility Overal bed mobility: Needs Assistance Bed Mobility: Supine to Sit, Sit to Supine     Supine to sit: Supervision Sit to supine: Supervision        Transfers Overall transfer level: Needs assistance Equipment used: 1 person hand held assist Transfers: Sit to/from Stand Sit to Stand: Contact guard assist                  Balance Overall balance assessment: Needs assistance Sitting-balance support: Feet supported Sitting balance-Leahy Scale: Good     Standing balance support: During functional activity Standing balance-Leahy Scale: Fair Standing balance comment: static standing                           ADL either performed or assessed with clinical judgement   ADL Overall ADL's : Needs assistance/impaired Eating/Feeding: Independent;Sitting   Grooming: Sitting;Set up   Upper Body Bathing: Sitting;Set up   Lower Body Bathing: Set up;Sitting/lateral leans   Upper Body Dressing : Sitting;Set up   Lower Body Dressing: Bed level;Set up Lower Body Dressing Details (indicate cue type and reason): don socks   Toilet Transfer Details (indicate cue type and reason): Pt declined OOB mobility, only receptive to stand. Anticipate she could do stand pivot with CGA           General ADL Comments: Pt refused ambulation, Educated pt on the use of IS and Ocapella to clear lung congestion     Vision         Perception         Praxis  Pertinent Vitals/Pain Pain Assessment Pain Assessment: Faces Faces Pain Scale: Hurts a little bit Pain Location: Lt hip Pain Descriptors / Indicators: Discomfort Pain Intervention(s): Monitored during session, Repositioned     Extremity/Trunk Assessment Upper Extremity Assessment Upper Extremity Assessment: Generalized weakness;Right hand dominant (Grossly 3+/5 bilaterally)           Communication Communication Communication: No apparent  difficulties   Cognition Arousal: Lethargic Behavior During Therapy: WFL for tasks assessed/performed                                 Following commands: Intact       Cueing  General Comments   Cueing Techniques: Verbal cues      Exercises     Shoulder Instructions      Home Living Family/patient expects to be discharged to:: Private residence Living Arrangements: Alone Available Help at Discharge: Family Type of Home: House Home Access: Stairs to enter Secretary/administrator of Steps: 4 Entrance Stairs-Rails: Left;Right;Can reach both Home Layout: One level     Bathroom Shower/Tub: Producer, television/film/video: Standard Bathroom Accessibility: Yes   Home Equipment: Shower seat;Grab bars - tub/shower;Wheelchair - manual   Additional Comments: unsure if daughter is still providing iADL assist for pt, pt reports her daughter moved to [place]      Prior Functioning/Environment Prior Level of Function : Independent/Modified Independent             Mobility Comments: in with no AD ADLs Comments: ind with dressing/bathing. does meals on wheels. (dtr brings meals). dtr does household tasks.    OT Problem List: Decreased strength;Decreased activity tolerance;Impaired balance (sitting and/or standing)   OT Treatment/Interventions: Self-care/ADL training;Patient/family education;Therapeutic exercise;Therapeutic activities;DME and/or AE instruction;Balance training      OT Goals(Current goals can be found in the care plan section)   Acute Rehab OT Goals Patient Stated Goal: To get better OT Goal Formulation: With patient Time For Goal Achievement: 05/13/23 Potential to Achieve Goals: Good ADL Goals Pt Will Perform Grooming: standing;with modified independence Pt Will Transfer to Toilet: with modified independence;ambulating Pt/caregiver will Perform Home Exercise Program: Increased strength;Both right and left upper extremity;With  theraband;With written HEP provided;Independently Additional ADL Goal #2: Pt will demonstrate independent use of energy conservation strategies prn to promote engagement in functional activities.   OT Frequency:  Min 1X/week    Co-evaluation              AM-PAC OT "6 Clicks" Daily Activity     Outcome Measure Help from another person eating meals?: None Help from another person taking care of personal grooming?: A Little Help from another person toileting, which includes using toliet, bedpan, or urinal?: A Little Help from another person bathing (including washing, rinsing, drying)?: A Little Help from another person to put on and taking off regular upper body clothing?: A Little Help from another person to put on and taking off regular lower body clothing?: A Little 6 Click Score: 19   End of Session Nurse Communication: Mobility status  Activity Tolerance: Patient tolerated treatment well Patient left: in bed;with call bell/phone within reach;with bed alarm set  OT Visit Diagnosis: Unsteadiness on feet (R26.81);Muscle weakness (generalized) (M62.81)                Time: 1610-9604 OT Time Calculation (min): 18 min Charges:  OT General Charges $OT Visit: 1 Visit OT Evaluation $OT Eval Low Complexity: 1 Low  04/29/2023  AB, OTR/L  Acute Rehabilitation Services  Office: [phone]   Tristan Schroeder 04/29/2023, 5:47 PM

## 2023-04-29 NOTE — Plan of Care (Signed)
  Problem: Education: Goal: Knowledge of General Education information will improve Description: Including pain rating scale, medication(s)/side effects and non-pharmacologic comfort measures Outcome: Progressing   Problem: Health Behavior/Discharge Planning: Goal: Ability to manage health-related needs will improve Outcome: Progressing   Problem: Clinical Measurements: Goal: Ability to maintain clinical measurements within normal limits will improve Outcome: Progressing Goal: Will remain free from infection Outcome: Progressing Goal: Diagnostic test results will improve Outcome: Progressing Goal: Respiratory complications will improve Outcome: Progressing Goal: Cardiovascular complication will be avoided Outcome: Progressing   Problem: Activity: Goal: Risk for activity intolerance will decrease Outcome: Progressing   Problem: Nutrition: Goal: Adequate nutrition will be maintained Outcome: Progressing   Problem: Coping: Goal: Level of anxiety will decrease Outcome: Progressing   Problem: Elimination: Goal: Will not experience complications related to bowel motility Outcome: Progressing Goal: Will not experience complications related to urinary retention Outcome: Progressing   Problem: Pain Managment: Goal: General experience of comfort will improve and/or be controlled Outcome: Progressing   Problem: Safety: Goal: Ability to remain free from injury will improve Outcome: Progressing   Problem: Skin Integrity: Goal: Risk for impaired skin integrity will decrease Outcome: Progressing   Problem: Activity: Goal: Ability to tolerate increased activity will improve Outcome: Progressing   Problem: Clinical Measurements: Goal: Ability to maintain a body temperature in the normal range will improve Outcome: Progressing   Problem: Respiratory: Goal: Ability to maintain adequate ventilation will improve Outcome: Progressing Goal: Ability to maintain a clear airway  will improve Outcome: Progressing   Problem: Education: Goal: Knowledge of disease or condition will improve Outcome: Progressing Goal: Knowledge of the prescribed therapeutic regimen will improve Outcome: Progressing Goal: Individualized Educational Video(s) Outcome: Progressing   Problem: Activity: Goal: Ability to tolerate increased activity will improve Outcome: Progressing Goal: Will verbalize the importance of balancing activity with adequate rest periods Outcome: Progressing   Problem: Respiratory: Goal: Ability to maintain a clear airway will improve Outcome: Progressing Goal: Levels of oxygenation will improve Outcome: Progressing Goal: Ability to maintain adequate ventilation will improve Outcome: Progressing

## 2023-04-29 NOTE — Plan of Care (Signed)
 Pt has rested quietly throughout the night with no distress noted. Alert and oriented but forgetful. On O2 6LNC. Wore bypap most of night and tolerated it well. No complaints voiced.     Problem: Clinical Measurements: Goal: Diagnostic test results will improve Outcome: Progressing Goal: Respiratory complications will improve Outcome: Progressing   Problem: Coping: Goal: Level of anxiety will decrease Outcome: Progressing   Problem: Pain Managment: Goal: General experience of comfort will improve and/or be controlled Outcome: Progressing   Problem: Respiratory: Goal: Levels of oxygenation will improve Outcome: Progressing Goal: Ability to maintain adequate ventilation will improve Outcome: Progressing

## 2023-04-29 NOTE — Progress Notes (Signed)
 Initial Nutrition Assessment  DOCUMENTATION CODES:   Severe malnutrition in context of chronic illness  INTERVENTION:  Ensure Plus High Protein po BID, each supplement provides 350 kcal and 20 grams of protein. Multivitamin/minerals Assess for appetite stimulant   NUTRITION DIAGNOSIS:   Severe Malnutrition related to chronic illness as evidenced by severe muscle depletion, severe fat depletion.    GOAL:   Patient will meet greater than or equal to 90% of their needs    MONITOR:   PO intake, Supplement acceptance  REASON FOR ASSESSMENT:   Consult Assessment of nutrition requirement/status  ASSESSMENT: .   58 y.o. F presented to ED with complaints of chills, harsh cough, increased oxygen requirement, and increased sleepiness. Admitting with acute hypoxic respiratory failure. PMH: COPD, HTN, Hypothyroidism, Bipolar disorder, MDD, CAD and lung cancer status post lobectomy on 2 L nasal cannula  Patient setting up in chair with daughter at bedside. Stated that her appetite started to decline in 2023 and has gotten worse over the last 6 months. Endorsing food has no flavor and when she does it she gets nauseated. Has not been able to determine what foods bother her the most. She use to drink a lot of coffee but it is now bothering and has quit drinking it. Discussed more small frequent meals, supplements and possible appetite stimulants.  UBW; 120#, last year.   Admit weight: 44.5 kg Weight history: 04/29/23 48.2 kg  05/22/19 58.1 kg  04/20/19 59.4 kg  04/14/19 62.1 kg  08/05/18 59 kg  05/19/18 58.5 kg  07/26/14 54.9 kg    Average Meal Intake  Nutritionally Relevant Medications: Scheduled Meds:  DULoxetine  30 mg Oral BID   metoprolol succinate  25 mg Oral Daily   oseltamivir  30 mg Oral BID    Labs Reviewed    NUTRITION - FOCUSED PHYSICAL EXAM:  Flowsheet Row Most Recent Value  Orbital Region Severe depletion  Upper Arm Region Severe depletion  Thoracic and  Lumbar Region Moderate depletion  Buccal Region Moderate depletion  Temple Region Severe depletion  Clavicle Bone Region Severe depletion  Clavicle and Acromion Bone Region Severe depletion  Scapular Bone Region Severe depletion  Dorsal Hand Severe depletion  Patellar Region Severe depletion  Anterior Thigh Region Severe depletion  Posterior Calf Region Severe depletion  Edema (RD Assessment) None  Hair Reviewed  Eyes Reviewed  Mouth Reviewed  Skin Reviewed  Nails Reviewed       Diet Order:   Diet Order             Diet regular Room service appropriate? Yes; Fluid consistency: Thin  Diet effective now                   EDUCATION NEEDS:   Education needs have been addressed  Skin:  Skin Assessment: Reviewed RN Assessment  Last BM:  PTA  Height:   Ht Readings from Last 1 Encounters:  04/27/23 5' (1.524 m)    Weight:   Wt Readings from Last 1 Encounters:  04/29/23 48.2 kg    Ideal Body Weight:     BMI:  Body mass index is 20.75 kg/m.  Estimated Nutritional Needs:   Kcal:  1445-1700 kcal  Protein:  65-80 g  Fluid:  +/=    Jamelle Haring RDN, LDN Clinical Dietitian   If unable to reach, please contact "RD Inpatient" secure chat group between 8 am-4 pm daily"

## 2023-04-29 NOTE — TOC Initial Note (Signed)
 Transition of Care [facility]) - Initial/Assessment Note    Patient Details  Name: Tami Zamora MRN: [id number] Date of Birth: Nov 28, 1965  Transition of Care [facility]) CM/SW Contact:    Lamonte Sakai, Student-Social Work Phone Number: 04/29/2023, 9:52 AM  Clinical Narrative:                 Pt admitted from home. TOC following for HHPT recommendation.        Patient Goals and CMS Choice            Expected Discharge Plan and Services       Living arrangements for the past 2 months: Single Family Home                                      Prior Living Arrangements/Services Living arrangements for the past 2 months: Single Family Home                     Activities of Daily Living   ADL Screening (condition at time of admission) Independently performs ADLs?: Yes (appropriate for developmental age) Is the patient deaf or have difficulty hearing?: No Does the patient have difficulty seeing, even when wearing glasses/contacts?: No Does the patient have difficulty concentrating, remembering, or making decisions?: No  Permission Sought/Granted                  Emotional Assessment              Admission diagnosis:  Hypoxia [R09.02] Respiratory failure with hypoxia (HCC) [J96.91] Pneumonia due to infectious organism, unspecified laterality, unspecified part of lung [J18.9] Acute hypoxic respiratory failure (HCC) [J96.01] Patient Active Problem List   Diagnosis Date Noted   Respiratory failure with hypoxia (HCC) 04/28/2023   Acute hypoxic respiratory failure (HCC) 04/27/2023   Oxygen dependent 04/12/2023   Hypothyroidism, unspecified 03/03/2023   Unspecified adrenocortical insufficiency (HCC) 03/03/2023   Progressive angina (HCC) - ~ Class III 04/20/2019   Pain in both lower extremities 04/14/2019   Aortic regurgitation 07/21/2018   Chest pain 05/19/2018   Dizzy 05/19/2018   Numbness in feet 05/19/2018   History of ST elevation myocardial  infarction (STEMI) 05/19/2018   Coronary artery disease of native artery of native heart with stable angina pectoris (HCC) 05/19/2018   Hyperlipidemia 05/19/2018   Chronic obstructive pulmonary disease with acute exacerbation (HCC) 12/08/2014   Major depressive disorder without psychotic features 12/08/2014   Tobacco abuse 07/26/2014   Chronic hypoxemic respiratory failure (HCC) 07/26/2014   Centrilobular emphysema, COPD 01/05/2008   Adenocarcinoma of lung, stage 1 (HCC) 11/28/2007   Hypertension 11/17/2007   PCP:  [name], MD Pharmacy:   [facility] [address] [place] - [address] [facility] [address] [place] [address] Phone: [phone] Fax: [phone]     Social Drivers of Health (SDOH) Social History: SDOH Screenings   Food Insecurity: Food Insecurity Present (04/28/2023)  Housing: Low Risk  (04/28/2023)  Transportation Needs: No Transportation Needs (04/28/2023)  Utilities: Not At Risk (04/28/2023)  Tobacco Use: High Risk (04/27/2023)   SDOH Interventions:     Readmission Risk Interventions     No data to display

## 2023-04-30 DIAGNOSIS — J9601 Acute respiratory failure with hypoxia: Secondary | ICD-10-CM | POA: Diagnosis not present

## 2023-04-30 DIAGNOSIS — J432 Centrilobular emphysema: Secondary | ICD-10-CM | POA: Diagnosis not present

## 2023-04-30 DIAGNOSIS — J9691 Respiratory failure, unspecified with hypoxia: Secondary | ICD-10-CM | POA: Diagnosis not present

## 2023-04-30 DIAGNOSIS — J9611 Chronic respiratory failure with hypoxia: Secondary | ICD-10-CM | POA: Diagnosis not present

## 2023-04-30 LAB — BASIC METABOLIC PANEL
Anion gap: 9 (ref 5–15)
BUN: 27 mg/dL — ABNORMAL HIGH (ref 6–20)
CO2: 42 mmol/L — ABNORMAL HIGH (ref 22–32)
Calcium: 9.1 mg/dL (ref 8.9–10.3)
Chloride: 92 mmol/L — ABNORMAL LOW (ref 98–111)
Creatinine, Ser: 0.46 mg/dL (ref 0.44–1.00)
GFR, Estimated: >60 mL/min (ref >60)
Glucose, Bld: 123 mg/dL — ABNORMAL HIGH (ref 70–99)
Potassium: 5 mmol/L (ref 3.5–5.1)
Sodium: 143 mmol/L (ref 135–145)

## 2023-04-30 MED ORDER — METHYLPREDNISOLONE SODIUM SUCC 40 MG IJ SOLR
20.0000 mg | Freq: Two times a day (BID) | INTRAMUSCULAR | Status: DC
  Administered 2023-04-30 – 2023-05-01 (×2): 20 mg via INTRAVENOUS
  Filled 2023-04-30 (×2): qty 1

## 2023-04-30 MED ORDER — METOPROLOL SUCCINATE ER 25 MG PO TB24
12.5000 mg | ORAL_TABLET | Freq: Every day | ORAL | Status: DC
  Administered 2023-04-30 – 2023-05-01 (×2): 12.5 mg via ORAL
  Filled 2023-04-30 (×2): qty 1

## 2023-04-30 MED ORDER — IPRATROPIUM-ALBUTEROL 0.5-2.5 (3) MG/3ML IN SOLN
3.0000 mL | Freq: Three times a day (TID) | RESPIRATORY_TRACT | Status: DC
  Filled 2023-04-30 (×3): qty 3

## 2023-04-30 MED ORDER — IPRATROPIUM-ALBUTEROL 0.5-2.5 (3) MG/3ML IN SOLN
3.0000 mL | Freq: Two times a day (BID) | RESPIRATORY_TRACT | Status: DC
  Administered 2023-04-30 – 2023-05-01 (×2): 3 mL via RESPIRATORY_TRACT
  Filled 2023-04-30 (×2): qty 3

## 2023-04-30 NOTE — Progress Notes (Addendum)
 Physical Therapy Treatment Patient Details Name: Tami Zamora MRN: [id number] DOB: 03/26/1965 Today's Date: 04/30/2023   History of Present Illness Tami Zamora is a 58 y.o. female who presented to the ED on 04/27/2023 with shortness of breath, noted to be lethargic and hypoxic worse than baseline. Workup revealed respiratory acidosis, Flu A +, Lt mid/base lung infiltrate. PT treated w/ BiPAP overnight but has improved significantly from both mental and respiratory standpoint. PMH significant for lung CA s/p lobectomy, CAD s/p PCI, 2L O2-dependent COPD/asthma, adrenal insufficiency.    PT Comments  Pt demo mod I bed mobility. CGA transfers and amb 20' without AD. Pt furniture walking in room for support. Pt fatigues quickly. Desat to 86% during mobility on 2L. SpO2 94% at rest on 2L. Educated on pursed lip breathing, flutter valve, and inspirometer. Recommending HHPT upon d/c but pt declining. HH initiated just prior to admission. She feels that is how she contracted the flu. PT to continue during hospitalization to assist with maximizing mobility/strength. Recommend rollator for home.     If plan is discharge home, recommend the following: A little help with walking and/or transfers;A little help with bathing/dressing/bathroom;Assistance with cooking/housework;Assist for transportation;Help with stairs or ramp for entrance   Can travel by private vehicle        Equipment Recommendations  Rollator (4 wheels)    Recommendations for Other Services       Precautions / Restrictions Precautions Precautions: Fall;Other (comment) Recall of Precautions/Restrictions: Intact Precaution/Restrictions Comments: watch SpO2     Mobility  Bed Mobility Overal bed mobility: Modified Independent                  Transfers Overall transfer level: Needs assistance Equipment used: None Transfers: Sit to/from Stand Sit to Stand: Contact guard assist           General transfer comment:  increased time to power up and stabilize balance    Ambulation/Gait Ambulation/Gait assistance: Contact guard assist Gait Distance (Feet): 20 Feet Assistive device: None Gait Pattern/deviations: Step-through pattern, Decreased stride length Gait velocity: decreased Gait velocity interpretation: <1.8 ft/sec, indicate of risk for recurrent falls   General Gait Details: Pt furniture walking for support in room. Mobilized on 2L with desat to 86%.   Stairs             Wheelchair Mobility     Tilt Bed    Modified Rankin (Stroke Patients Only)       Balance Overall balance assessment: Needs assistance Sitting-balance support: Feet supported, No upper extremity supported Sitting balance-Leahy Scale: Good     Standing balance support: During functional activity, No upper extremity supported, Single extremity supported Standing balance-Leahy Scale: Fair                              Hotel manager: No apparent difficulties  Cognition Arousal: Alert Behavior During Therapy: WFL for tasks assessed/performed   PT - Cognitive impairments: No apparent impairments                         Following commands: Intact      Cueing    Exercises      General Comments General comments (skin integrity, edema, etc.): On on 2L continuous O2. 94% at rest. Desat to 86% during amb. Pt with c/o trembling/feeling shaky. RN relays this is most likely due to high dose steroids.  Pertinent Vitals/Pain Pain Assessment Pain Assessment: No/denies pain    Home Living                          Prior Function            PT Goals (current goals can now be found in the care plan section) Acute Rehab PT Goals Patient Stated Goal: home, independence Progress towards PT goals: Progressing toward goals    Frequency    Min 1X/week      PT Plan      Co-evaluation              AM-PAC PT "6 Clicks" Mobility    Outcome Measure  Help needed turning from your back to your side while in a flat bed without using bedrails?: None Help needed moving from lying on your back to sitting on the side of a flat bed without using bedrails?: None Help needed moving to and from a bed to a chair (including a wheelchair)?: A Little Help needed standing up from a chair using your arms (e.g., wheelchair or bedside chair)?: A Little Help needed to walk in hospital room?: A Little Help needed climbing 3-5 steps with a railing? : A Lot 6 Click Score: 19    End of Session Equipment Utilized During Treatment: Oxygen;Gait belt Activity Tolerance: Patient tolerated treatment well Patient left: in bed;with call bell/phone within reach;with family/visitor present Nurse Communication: Mobility status PT Visit Diagnosis: Other abnormalities of gait and mobility (R26.89);Muscle weakness (generalized) (M62.81);Difficulty in walking, not elsewhere classified (R26.2)     Time: 1201-1225 PT Time Calculation (min) (ACUTE ONLY): 24 min  Charges:    $Gait Training: 23-37 mins PT General Charges $$ ACUTE PT VISIT: 1 Visit                     Ferd Glassing., PT  Office # [phone]    Ilda Foil 04/30/2023, 12:43 PM

## 2023-04-30 NOTE — Progress Notes (Signed)
 PROGRESS NOTE        PATIENT DETAILS Name: Tami Zamora Age: 58 y.o. Sex: female Date of Birth: [birth year] Admit Date: 04/27/2023 Admitting Physician Buena Irish, MD PCP:Lee, Marquette Saa, MD  Brief Summary: Patient is a 58 y.o.  female with history of COPD on home O2 2 L, adenocarcinoma of lung-s/p left upper lobe segmental resection in 2009, adrenal insufficiency-presented with cough/shortness of breath-found to have acute on chronic hypoxic/hypercarbic respiratory failure secondary to COPD exacerbation in the setting of influenza A PNA.  She initially required BiPAP-but rapidly improved with steroids/antimicrobial therapy/bronchodilators.  See below for further details.  Significant events: 2/25>> admit to [facility]-hypoxia-COPD exacerbation/influenza-BiPAP  Significant studies: 2/25>> CXR: Mid left lung/bibasilar infiltrates.  Significant microbiology data: 2/25>> influenza A PCR: Positive 2/25>> COVID/influenza B/RSV PCR: Negative  Procedures: None  Consults: None  Subjective: Feels much better-still coughing-appears slightly weak.  Claims he sat on the chair for several hours yesterday.  Stable on just 2 L of oxygen.  Objective: Vitals: Blood pressure (!) 102/56, pulse 77, temperature 98.5 F (36.9 C), temperature source Oral, resp. rate 20, height 5' (1.524 m), weight 48.2 kg, SpO2 95%.   Exam: Gen Exam:Alert awake-not in any distress HEENT:atraumatic, normocephalic Chest: Air well-hardly any rhonchi today. CVS:S1S2 regular Abdomen:soft non tender, non distended Extremities:no edema Neurology: Non focal Skin: no rash  Pertinent Labs/Radiology:    Latest Ref Rng & Units 04/29/2023    7:40 AM 04/27/2023    6:49 PM 04/14/2019    2:38 PM  CBC  WBC 4.0 - 10.5 K/uL 3.5  3.5  9.9   Hemoglobin 12.0 - 15.0 g/dL 16.1  09.6  04.5   Hematocrit 36.0 - 46.0 % 34.7  42.4  44.3   Platelets 150 - 400 K/uL 141  80  229     Lab Results  Component  Value Date   NA 143 04/30/2023   K 5.0 04/30/2023   CL 92 (L) 04/30/2023   CO2 42 (H) 04/30/2023      Assessment/Plan: Acute on chronic hypoxic/hypercarbic respiratory failure secondary to COPD exacerbation due to influenza A PNA Much improved Taper steroids further. Continue scheduled bronchodilators Continue Tamiflu No longer on IV antibiotics Mobilize/encourage use of incentive spirometry/flutter valve If clinical improvement continues-suspect home in the next 1-2 days.  Adrenal insufficiency On tapering IV Solu-Medrol-with plans to transition back to her usual regimen of hydrocortisone soon.    Mild leukopenia/thrombocytopenia Suspect secondary to viral syndrome/influenza Follow CBC periodically.  CAD s/p PCI No anginal symptoms Continue aspirin/beta-blocker/Imdur  HTN BP soft Hold Imdur today Reduce dose of metoprolol today.  Hyperkalemia Resolved with Lokelma.  History of adenocarcinoma of the left lung-s/p left upper lobe lobectomy.  Nutrition Status: Nutrition Problem: Severe Malnutrition Etiology: chronic illness Signs/Symptoms: severe muscle depletion, severe fat depletion Interventions: Ensure Enlive (each supplement provides 350kcal and 20 grams of protein), MVI, Hormel Shake    Code status:   Code Status: Full Code   DVT Prophylaxis: enoxaparin (LOVENOX) injection 30 mg Start: 04/28/23 1000   Family Communication: Daughter at bedside on 2/27-none at bedside this morning.   Disposition Plan: Status is: Inpatient Remains inpatient appropriate because: Severity of illness   Planned Discharge Destination:Home health   Diet: Diet Order             Diet regular Room service appropriate? Yes; Fluid consistency: Thin  Diet effective now                     Antimicrobial agents: Anti-infectives (From admission, onward)    Start     Dose/Rate Route Frequency Ordered Stop   04/29/23 1115  azithromycin (ZITHROMAX) tablet 500 mg         500 mg Oral  Once 04/29/23 1023 04/29/23 1243   04/28/23 2200  cefTRIAXone (ROCEPHIN) 1 g in sodium chloride 0.9 % 100 mL IVPB  Status:  Discontinued        1 g 200 mL/hr over 30 Minutes Intravenous Every 24 hours 04/28/23 0239 04/29/23 1020   04/28/23 2200  azithromycin (ZITHROMAX) 500 mg in sodium chloride 0.9 % 250 mL IVPB  Status:  Discontinued        500 mg 250 mL/hr over 60 Minutes Intravenous Every 24 hours 04/28/23 0239 04/29/23 1023   04/28/23 1045  valACYclovir (VALTREX) tablet 500 mg        500 mg Oral Daily 04/28/23 1036     04/28/23 0445  oseltamivir (TAMIFLU) capsule 30 mg        30 mg Oral 2 times daily 04/28/23 0355 05/03/23 0959   04/27/23 2100  cefTRIAXone (ROCEPHIN) 1 g in sodium chloride 0.9 % 100 mL IVPB        1 g 200 mL/hr over 30 Minutes Intravenous  Once 04/27/23 2056 04/27/23 2217   04/27/23 2100  azithromycin (ZITHROMAX) 500 mg in sodium chloride 0.9 % 250 mL IVPB        500 mg 250 mL/hr over 60 Minutes Intravenous  Once 04/27/23 2056 04/28/23 0121        MEDICATIONS: Scheduled Meds:  aspirin  81 mg Oral Daily   DULoxetine  30 mg Oral BID   enoxaparin (LOVENOX) injection  30 mg Subcutaneous Q24H   feeding supplement  237 mL Oral BID BM   fluticasone furoate-vilanterol  1 puff Inhalation Daily   guaiFENesin  600 mg Oral BID   ipratropium-albuterol  3 mL Nebulization TID   isosorbide mononitrate  30 mg Oral Daily   methylPREDNISolone (SOLU-MEDROL) injection  40 mg Intravenous Q12H   metoprolol succinate  12.5 mg Oral Daily   montelukast  10 mg Oral Daily   multivitamin with minerals  1 tablet Oral Daily   oseltamivir  30 mg Oral BID   pregabalin  100 mg Oral BID   valACYclovir  500 mg Oral Daily   Continuous Infusions:   PRN Meds:.acetaminophen **OR** acetaminophen, albuterol, ALPRAZolam, benzonatate, bisacodyl, ondansetron **OR** ondansetron (ZOFRAN) IV   I have personally reviewed following labs and imaging studies  LABORATORY  DATA: CBC: Recent Labs  Lab 04/27/23 1849 04/29/23 0740  WBC 3.5* 3.5*  HGB 12.9 10.8*  HCT 42.4 34.7*  MCV 102.4* 100.6*  PLT 80* 141*    Basic Metabolic Panel: Recent Labs  Lab 04/27/23 1849 04/29/23 0740 04/30/23 0555  NA 135 142 143  K 4.9 5.2* 5.0  CL 87* 90* 92*  CO2 40* 43* 42*  GLUCOSE 108* 124* 123*  BUN 17 28* 27*  CREATININE 0.69 0.46 0.46  CALCIUM 8.4* 9.1 9.1    GFR: Estimated Creatinine Clearance: 55.7 mL/min (by C-G formula based on SCr of 0.46 mg/dL).  Liver Function Tests: Recent Labs  Lab 04/27/23 1849  AST 32  ALT 10  ALKPHOS 57  BILITOT 0.7  PROT 5.8*  ALBUMIN 2.7*   Recent Labs  Lab 04/27/23 1849  LIPASE 28  No results for input(s): "AMMONIA" in the last 168 hours.  Coagulation Profile: No results for input(s): "INR", "PROTIME" in the last 168 hours.  Cardiac Enzymes: No results for input(s): "CKTOTAL", "CKMB", "CKMBINDEX", "TROPONINI" in the last 168 hours.  BNP (last 3 results) No results for input(s): "PROBNP" in the last 8760 hours.  Lipid Profile: No results for input(s): "CHOL", "HDL", "LDLCALC", "TRIG", "CHOLHDL", "LDLDIRECT" in the last 72 hours.  Thyroid Function Tests: No results for input(s): "TSH", "T4TOTAL", "FREET4", "T3FREE", "THYROIDAB" in the last 72 hours.  Anemia Panel: No results for input(s): "VITAMINB12", "FOLATE", "FERRITIN", "TIBC", "IRON", "RETICCTPCT" in the last 72 hours.  Urine analysis:    Component Value Date/Time   COLORURINE AMBER BIOCHEMICALS MAY BE AFFECTED BY COLOR (A) 12/15/2007 1135   APPEARANCEUR CLOUDY (A) 12/15/2007 1135   LABSPEC 1.031 (H) 12/15/2007 1135   PHURINE 6.0 12/15/2007 1135   GLUCOSEU NEGATIVE 12/15/2007 1135   HGBUR NEGATIVE 12/15/2007 1135   BILIRUBINUR NEGATIVE 12/15/2007 1135   KETONESUR 15 (A) 12/15/2007 1135   PROTEINUR 30 (A) 12/15/2007 1135   UROBILINOGEN 1.0 12/15/2007 1135   NITRITE NEGATIVE 12/15/2007 1135   LEUKOCYTESUR NEGATIVE 12/15/2007 1135     Sepsis Labs: Lactic Acid, Venous No results found for: "LATICACIDVEN"  MICROBIOLOGY: Recent Results (from the past 240 hours)  Resp panel by RT-PCR (RSV, Flu A&B, Covid) Anterior Nasal Swab     Status: Abnormal   Collection Time: 04/27/23  8:11 PM   Specimen: Anterior Nasal Swab  Result Value Ref Range Status   SARS Coronavirus 2 by RT PCR NEGATIVE NEGATIVE Final   Influenza A by PCR POSITIVE (A) NEGATIVE Final   Influenza B by PCR NEGATIVE NEGATIVE Final    Comment: (NOTE) The Xpert Xpress SARS-CoV-2/FLU/RSV plus assay is intended as an aid in the diagnosis of influenza from Nasopharyngeal swab specimens and should not be used as a sole basis for treatment. Nasal washings and aspirates are unacceptable for Xpert Xpress SARS-CoV-2/FLU/RSV testing.  Fact Sheet for Patients: BloggerCourse.com  Fact Sheet for Healthcare Providers: SeriousBroker.it  This test is not yet approved or cleared by the [place] FDA and has been authorized for detection and/or diagnosis of SARS-CoV-2 by FDA under an Emergency Use Authorization (EUA). This EUA will remain in effect (meaning this test can be used) for the duration of the COVID-19 declaration under Section 564(b)(1) of the Act, 21 U.S.C. section 360bbb-3(b)(1), unless the authorization is terminated or revoked.     Resp Syncytial Virus by PCR NEGATIVE NEGATIVE Final    Comment: (NOTE) Fact Sheet for Patients: BloggerCourse.com  Fact Sheet for Healthcare Providers: SeriousBroker.it  This test is not yet approved or cleared by the [place] FDA and has been authorized for detection and/or diagnosis of SARS-CoV-2 by FDA under an Emergency Use Authorization (EUA). This EUA will remain in effect (meaning this test can be used) for the duration of the COVID-19 declaration under Section 564(b)(1) of the Act, 21 U.S.C. section  360bbb-3(b)(1), unless the authorization is terminated or revoked.  Performed at [facility], [address], [place], [place] [address]     RADIOLOGY STUDIES/RESULTS: No results found.    LOS: 3 days   Jeoffrey Massed, MD  [facility]    To contact the attending provider between 7A-7P or the covering provider during after hours 7P-7A, please log into the web site [email or web address] and access using universal [place] password for that web site. If you do not have the password, please call  the hospital operator.  04/30/2023, 11:26 AM

## 2023-04-30 NOTE — Plan of Care (Signed)
  Problem: Education: Goal: Knowledge of General Education information will improve Description: Including pain rating scale, medication(s)/side effects and non-pharmacologic comfort measures Outcome: Progressing   Problem: Health Behavior/Discharge Planning: Goal: Ability to manage health-related needs will improve Outcome: Progressing   Problem: Clinical Measurements: Goal: Ability to maintain clinical measurements within normal limits will improve Outcome: Progressing Goal: Will remain free from infection Outcome: Progressing Goal: Diagnostic test results will improve Outcome: Progressing Goal: Respiratory complications will improve Outcome: Progressing Goal: Cardiovascular complication will be avoided Outcome: Progressing   Problem: Activity: Goal: Risk for activity intolerance will decrease Outcome: Progressing   Problem: Nutrition: Goal: Adequate nutrition will be maintained Outcome: Progressing   Problem: Coping: Goal: Level of anxiety will decrease Outcome: Progressing   Problem: Elimination: Goal: Will not experience complications related to bowel motility Outcome: Progressing Goal: Will not experience complications related to urinary retention Outcome: Progressing   Problem: Pain Managment: Goal: General experience of comfort will improve and/or be controlled Outcome: Progressing   Problem: Safety: Goal: Ability to remain free from injury will improve Outcome: Progressing   Problem: Skin Integrity: Goal: Risk for impaired skin integrity will decrease Outcome: Progressing   Problem: Activity: Goal: Ability to tolerate increased activity will improve Outcome: Progressing   Problem: Clinical Measurements: Goal: Ability to maintain a body temperature in the normal range will improve Outcome: Progressing   Problem: Respiratory: Goal: Ability to maintain adequate ventilation will improve Outcome: Progressing Goal: Ability to maintain a clear airway  will improve Outcome: Progressing   Problem: Education: Goal: Knowledge of disease or condition will improve Outcome: Progressing Goal: Knowledge of the prescribed therapeutic regimen will improve Outcome: Progressing Goal: Individualized Educational Video(s) Outcome: Progressing   Problem: Activity: Goal: Ability to tolerate increased activity will improve Outcome: Progressing Goal: Will verbalize the importance of balancing activity with adequate rest periods Outcome: Progressing   Problem: Respiratory: Goal: Ability to maintain a clear airway will improve Outcome: Progressing Goal: Levels of oxygenation will improve Outcome: Progressing Goal: Ability to maintain adequate ventilation will improve Outcome: Progressing

## 2023-05-01 ENCOUNTER — Other Ambulatory Visit (HOSPITAL_COMMUNITY): Payer: Self-pay

## 2023-05-01 DIAGNOSIS — J9601 Acute respiratory failure with hypoxia: Secondary | ICD-10-CM | POA: Diagnosis not present

## 2023-05-01 DIAGNOSIS — E43 Unspecified severe protein-calorie malnutrition: Secondary | ICD-10-CM

## 2023-05-01 DIAGNOSIS — J9611 Chronic respiratory failure with hypoxia: Secondary | ICD-10-CM | POA: Diagnosis not present

## 2023-05-01 DIAGNOSIS — J432 Centrilobular emphysema: Secondary | ICD-10-CM | POA: Diagnosis not present

## 2023-05-01 LAB — URINALYSIS, ROUTINE W REFLEX MICROSCOPIC
Bilirubin Urine: NEGATIVE
Glucose, UA: NEGATIVE mg/dL
Hgb urine dipstick: NEGATIVE
Ketones, ur: NEGATIVE mg/dL
Leukocytes,Ua: NEGATIVE
Nitrite: NEGATIVE
Protein, ur: NEGATIVE mg/dL
Specific Gravity, Urine: 1.02 (ref 1.005–1.030)
pH: 7 (ref 5.0–8.0)

## 2023-05-01 MED ORDER — OSELTAMIVIR PHOSPHATE 30 MG PO CAPS
30.0000 mg | ORAL_CAPSULE | Freq: Two times a day (BID) | ORAL | 0 refills | Status: AC
  Filled 2023-05-01: qty 3, 2d supply, fill #0

## 2023-05-01 MED ORDER — BENZONATATE 200 MG PO CAPS
200.0000 mg | ORAL_CAPSULE | Freq: Three times a day (TID) | ORAL | 0 refills | Status: AC | PRN
  Filled 2023-05-01: qty 20, 7d supply, fill #0

## 2023-05-01 NOTE — Discharge Summary (Signed)
 PATIENT DETAILS Name: Tami Zamora Age: 58 y.o. Sex: female Date of Birth: 03-16-1965 MRN: [id number]. Admitting Physician: Tami Irish, MD PCP:Lee, Marquette Saa, MD  Admit Date: 04/27/2023 Discharge date: 05/01/2023  Recommendations for Outpatient Follow-up:  Follow up with PCP in 1-2 weeks Please obtain CMP/CBC in one week  Admitted From:  Home  Disposition: Home   Discharge Condition: good  CODE STATUS:   Code Status: Full Code   Diet recommendation:  Diet Order             Diet - low sodium heart healthy           Diet regular Room service appropriate? Yes; Fluid consistency: Thin  Diet effective now                    Brief Summary: Patient is a 58 y.o.  female with history of COPD on home O2 2 L, adenocarcinoma of lung-s/p left upper lobe segmental resection in 2009, adrenal insufficiency-presented with cough/shortness of breath-found to have acute on chronic hypoxic/hypercarbic respiratory failure secondary to COPD exacerbation in the setting of influenza A PNA.  She initially required BiPAP-but rapidly improved with steroids/antimicrobial therapy/bronchodilators.  See below for further details.   Significant events: 2/25>> admit to [facility]-hypoxia-COPD exacerbation/influenza-BiPAP   Significant studies: 2/25>> CXR: Mid left lung/bibasilar infiltrates.   Significant microbiology data: 2/25>> influenza A PCR: Positive 2/25>> COVID/influenza B/RSV PCR: Negative   Procedures: None   Consults: None  Brief Hospital Course: Acute on chronic hypoxic/hypercarbic respiratory failure secondary to COPD exacerbation due to influenza A PNA Significantly better-treated with IV steroids/bronchodilators/Tamiflu-initially was on IV antibiotics as well Lungs completely clear on exam this morning-back to usual home regimen-steroids have been titrated down-she will complete Tamiflu x 5 days-continue usual bronchodilator regimen-and follow-up with the PCP Annual  influenza vaccination was encouraged.   Adrenal insufficiency Was on IV Solu-Medrol for above-will be transition back to hydrocortisone on discharge.   Mild leukopenia/thrombocytopenia Suspect secondary to viral syndrome/influenza Follow CBC periodically.   CAD s/p PCI No anginal symptoms Continue aspirin/beta-blocker/Imdur   HTN BP stable Resume usual antihypertensives on discharge.   Hyperkalemia Resolved with Lokelma.   History of adenocarcinoma of the left lung-s/p left upper lobe lobectomy.   Nutrition Status: Nutrition Problem: Severe Malnutrition Etiology: chronic illness Signs/Symptoms: severe muscle depletion, severe fat depletion Interventions: Ensure Enlive (each supplement provides 350kcal and 20 grams of protein), MVI, Hormel Shake  Discharge Diagnoses:  Principal Problem:   Acute hypoxic respiratory failure (HCC) Active Problems:   Centrilobular emphysema, COPD   Chronic hypoxemic respiratory failure (HCC)   Respiratory failure with hypoxia (HCC)   Protein-calorie malnutrition, severe   Discharge Instructions:  Activity:  As tolerated with Full fall precautions use walker/cane & assistance as needed  Discharge Instructions     Call MD for:  difficulty breathing, headache or visual disturbances   Complete by: As directed    Diet - low sodium heart healthy   Complete by: As directed    Discharge instructions   Complete by: As directed    Follow with Primary MD  Tami Curia, MD in 1-2 weeks  Please get a complete blood count and chemistry panel checked by your Primary MD at your next visit, and again as instructed by your Primary MD.  Get Medicines reviewed and adjusted: Please take all your medications with you for your next visit with your Primary MD  Laboratory/radiological data: Please request your Primary MD to go over all hospital tests  and procedure/radiological results at the follow up, please ask your Primary MD to get all Hospital records  sent to his/her office.  In some cases, they will be blood work, cultures and biopsy results pending at the time of your discharge. Please request that your primary care M.D. follows up on these results.  Also Note the following: If you experience worsening of your admission symptoms, develop shortness of breath, life threatening emergency, suicidal or homicidal thoughts you must seek medical attention immediately by calling 911 or calling your MD immediately  if symptoms less severe.  You must read complete instructions/literature along with all the possible adverse reactions/side effects for all the Medicines you take and that have been prescribed to you. Take any new Medicines after you have completely understood and accpet all the possible adverse reactions/side effects.   Do not drive when taking Pain medications or sleeping medications (Benzodaizepines)  Do not take more than prescribed Pain, Sleep and Anxiety Medications. It is not advisable to combine anxiety,sleep and pain medications without talking with your primary care practitioner  Special Instructions: If you have smoked or chewed Tobacco  in the last 2 yrs please stop smoking, stop any regular Alcohol  and or any Recreational drug use.  Wear Seat belts while driving.  Please note: You were cared for by a hospitalist during your hospital stay. Once you are discharged, your primary care physician will handle any further medical issues. Please note that NO REFILLS for any discharge medications will be authorized once you are discharged, as it is imperative that you return to your primary care physician (or establish a relationship with a primary care physician if you do not have one) for your post hospital discharge needs so that they can reassess your need for medications and monitor your lab values.   Increase activity slowly   Complete by: As directed       Allergies as of 05/01/2023       Reactions   Prednisone Swelling    Morphine Hives, Rash   Morphine And Codeine Hives, Rash   Penicillins Rash        Medication List     STOP taking these medications    sulfamethoxazole-trimethoprim 800-160 MG tablet Commonly known as: BACTRIM DS       TAKE these medications    albuterol 108 (90 Base) MCG/ACT inhaler Commonly known as: VENTOLIN HFA Inhale 2 puffs into the lungs every 4 (four) hours as needed for wheezing or shortness of breath.   albuterol (2.5 MG/3ML) 0.083% nebulizer solution Commonly known as: PROVENTIL Take 2.5 mg by nebulization as needed for wheezing or shortness of breath.   ALPRAZolam 0.25 MG tablet Commonly known as: XANAX Take 0.25 mg by mouth 2 (two) times daily.   aspirin 81 MG chewable tablet Chew 81 mg by mouth daily.   atorvastatin 80 MG tablet Commonly known as: LIPITOR Take 80 mg by mouth daily at 6 PM.   benzonatate 200 MG capsule Commonly known as: TESSALON Take 1 capsule (200 mg total) by mouth 3 (three) times daily as needed for cough.   celecoxib 200 MG capsule Commonly known as: CELEBREX Take 200 mg by mouth 2 (two) times daily.   diltiazem 240 MG 24 hr capsule Commonly known as: CARDIZEM CD Take 1 capsule (240 mg total) by mouth daily. What changed: when to take this   DULoxetine 30 MG capsule Commonly known as: CYMBALTA Take 30 mg by mouth 2 (two) times daily.   ferrous  sulfate 325 (65 FE) MG EC tablet Take 325 mg by mouth every other day.   guaiFENesin 600 MG 12 hr tablet Commonly known as: MUCINEX Take 600 mg by mouth as needed for cough or to loosen phlegm.   hydrocortisone 5 MG tablet Commonly known as: CORTEF Take 5 mg by mouth 2 (two) times daily.   ipratropium-albuterol 0.5-2.5 (3) MG/3ML Soln Commonly known as: DUONEB USE 3 ML IN NEBULIZER EVERY 6 TO 8 HOURS AS NEEDED FOR WHEEZING   isosorbide mononitrate 30 MG 24 hr tablet Commonly known as: IMDUR **NO REFILLS UNTIL SEEN IN OFFICE**   magnesium oxide 400 MG tablet Commonly  known as: MAG-OX Take 400 mg by mouth at bedtime.   metoprolol succinate 25 MG 24 hr tablet Commonly known as: TOPROL-XL Take 25 mg by mouth daily.   montelukast 10 MG tablet Commonly known as: SINGULAIR Take 10 mg by mouth daily.   nitroGLYCERIN 0.4 MG SL tablet Commonly known as: NITROSTAT Place 1 tablet (0.4 mg total) under the tongue every 5 (five) minutes as needed for chest pain.   oseltamivir 30 MG capsule Commonly known as: TAMIFLU Take 1 capsule (30 mg total) by mouth 2 (two) times daily for 3 doses.   OXYGEN Inhale 2 L into the lungs continuous.   pregabalin 100 MG capsule Commonly known as: LYRICA Take 100 mg by mouth 2 (two) times daily.   Trelegy Ellipta 100-62.5-25 MCG/ACT Aepb Generic drug: Fluticasone-Umeclidin-Vilant Inhale 1 puff into the lungs daily.   valACYclovir 500 MG tablet Commonly known as: VALTREX Take 500 mg by mouth at bedtime.        Follow-up Information     [facility] Follow up.   Specialty: Home Health Services Why: Frances Furbish will contact you within 48 hours of discharge to  arrange a home health visit Contact information: [address] [place] [address] [phone]         Tami Curia, MD. Schedule an appointment as soon as possible for a visit in 1 week(s).   Specialty: Internal Medicine Contact information: [address] [place] [address] [phone]                Allergies  Allergen Reactions   Prednisone Swelling   Morphine Hives and Rash   Morphine And Codeine Hives and Rash   Penicillins Rash     Other Procedures/Studies: DG Chest 2 View Result Date: 04/27/2023 CLINICAL DATA:  Chest pain and shortness of breath. EXAM: CHEST - 2 VIEW COMPARISON:  May 19, 2019 FINDINGS: The heart size and mediastinal contours are within normal limits. Mild, chronic appearing increased interstitial lung markings are seen. Surgical sutures and surgical clips are present  overlying the left hilar region. Mild infiltrates are noted within the mid left lung and bilateral lung bases. No pleural effusion or pneumothorax is identified. The visualized skeletal structures are unremarkable. IMPRESSION: Mild mid left lung and bibasilar infiltrates. Electronically Signed   By: Aram Candela M.D.   On: 04/27/2023 19:31     TODAY-DAY OF DISCHARGE:  Subjective:   Tami Zamora today has no headache,no chest abdominal pain,no new weakness tingling or numbness, feels much better wants to go home today.   Objective:   Blood pressure (!) 112/49, pulse 61, temperature 97.9 F (36.6 C), temperature source Oral, resp. rate 18, height 5' (1.524 m), weight 48.2 kg, SpO2 100%.  Intake/Output Summary (Last 24 hours) at 05/01/2023 0850 Last data filed at 05/01/2023  1610 Gross per 24 hour  Intake 480 ml  Output 450 ml  Net 30 ml   Filed Weights   04/27/23 1821 04/29/23 0453  Weight: 44.5 kg 48.2 kg    Exam: Awake Alert, Oriented *3, No new F.N deficits, Normal affect [place].AT,PERRAL Supple Neck,No JVD, No cervical lymphadenopathy appriciated.  Symmetrical Chest wall movement, Good air movement bilaterally, CTAB RRR,No Gallops,Rubs or new Murmurs, No Parasternal Heave +ve B.Sounds, Abd Soft, Non tender, No organomegaly appriciated, No rebound -guarding or rigidity. No Cyanosis, Clubbing or edema, No new Rash or bruise   PERTINENT RADIOLOGIC STUDIES: No results found.   PERTINENT LAB RESULTS: CBC: Recent Labs    04/29/23 0740  WBC 3.5*  HGB 10.8*  HCT 34.7*  PLT 141*   CMET CMP     Component Value Date/Time   NA 143 04/30/2023 0555   NA 141 04/14/2019 1438   K 5.0 04/30/2023 0555   CL 92 (L) 04/30/2023 0555   CO2 42 (H) 04/30/2023 0555   GLUCOSE 123 (H) 04/30/2023 0555   BUN 27 (H) 04/30/2023 0555   BUN 19 04/14/2019 1438   CREATININE 0.46 04/30/2023 0555   CALCIUM 9.1 04/30/2023 0555   PROT 5.8 (L) 04/27/2023 1849   PROT 6.8 08/05/2018 1422    ALBUMIN 2.7 (L) 04/27/2023 1849   ALBUMIN 4.5 08/05/2018 1422   AST 32 04/27/2023 1849   ALT 10 04/27/2023 1849   ALKPHOS 57 04/27/2023 1849   BILITOT 0.7 04/27/2023 1849   BILITOT 0.7 08/05/2018 1422   GFRNONAA >60 04/30/2023 0555    GFR Estimated Creatinine Clearance: 55.7 mL/min (by C-G formula based on SCr of 0.46 mg/dL). No results for input(s): "LIPASE", "AMYLASE" in the last 72 hours. No results for input(s): "CKTOTAL", "CKMB", "CKMBINDEX", "TROPONINI" in the last 72 hours. Invalid input(s): "POCBNP" No results for input(s): "DDIMER" in the last 72 hours. No results for input(s): "HGBA1C" in the last 72 hours. No results for input(s): "CHOL", "HDL", "LDLCALC", "TRIG", "CHOLHDL", "LDLDIRECT" in the last 72 hours. No results for input(s): "TSH", "T4TOTAL", "T3FREE", "THYROIDAB" in the last 72 hours.  Invalid input(s): "FREET3" No results for input(s): "VITAMINB12", "FOLATE", "FERRITIN", "TIBC", "IRON", "RETICCTPCT" in the last 72 hours. Coags: No results for input(s): "INR" in the last 72 hours.  Invalid input(s): "PT" Microbiology: Recent Results (from the past 240 hours)  Resp panel by RT-PCR (RSV, Flu A&B, Covid) Anterior Nasal Swab     Status: Abnormal   Collection Time: 04/27/23  8:11 PM   Specimen: Anterior Nasal Swab  Result Value Ref Range Status   SARS Coronavirus 2 by RT PCR NEGATIVE NEGATIVE Final   Influenza A by PCR POSITIVE (A) NEGATIVE Final   Influenza B by PCR NEGATIVE NEGATIVE Final    Comment: (NOTE) The Xpert Xpress SARS-CoV-2/FLU/RSV plus assay is intended as an aid in the diagnosis of influenza from Nasopharyngeal swab specimens and should not be used as a sole basis for treatment. Nasal washings and aspirates are unacceptable for Xpert Xpress SARS-CoV-2/FLU/RSV testing.  Fact Sheet for Patients: BloggerCourse.com  Fact Sheet for Healthcare Providers: SeriousBroker.it  This test is not yet  approved or cleared by the [place] FDA and has been authorized for detection and/or diagnosis of SARS-CoV-2 by FDA under an Emergency Use Authorization (EUA). This EUA will remain in effect (meaning this test can be used) for the duration of the COVID-19 declaration under Section 564(b)(1) of the Act, 21 U.S.C. section 360bbb-3(b)(1), unless the authorization is terminated or revoked.  Resp Syncytial Virus by PCR NEGATIVE NEGATIVE Final    Comment: (NOTE) Fact Sheet for Patients: BloggerCourse.com  Fact Sheet for Healthcare Providers: SeriousBroker.it  This test is not yet approved or cleared by the [place] FDA and has been authorized for detection and/or diagnosis of SARS-CoV-2 by FDA under an Emergency Use Authorization (EUA). This EUA will remain in effect (meaning this test can be used) for the duration of the COVID-19 declaration under Section 564(b)(1) of the Act, 21 U.S.C. section 360bbb-3(b)(1), unless the authorization is terminated or revoked.  Performed at [facility], [address] [place], [place] [address]     FURTHER DISCHARGE INSTRUCTIONS:  Get Medicines reviewed and adjusted: Please take all your medications with you for your next visit with your Primary MD  Laboratory/radiological data: Please request your Primary MD to go over all hospital tests and procedure/radiological results at the follow up, please ask your Primary MD to get all Hospital records sent to his/her office.  In some cases, they will be blood work, cultures and biopsy results pending at the time of your discharge. Please request that your primary care M.D. goes through all the records of your hospital data and follows up on these results.  Also Note the following: If you experience worsening of your admission symptoms, develop shortness of breath, life threatening emergency, suicidal or homicidal thoughts you must seek  medical attention immediately by calling 911 or calling your MD immediately  if symptoms less severe.  You must read complete instructions/literature along with all the possible adverse reactions/side effects for all the Medicines you take and that have been prescribed to you. Take any new Medicines after you have completely understood and accpet all the possible adverse reactions/side effects.   Do not drive when taking Pain medications or sleeping medications (Benzodaizepines)  Do not take more than prescribed Pain, Sleep and Anxiety Medications. It is not advisable to combine anxiety,sleep and pain medications without talking with your primary care practitioner  Special Instructions: If you have smoked or chewed Tobacco  in the last 2 yrs please stop smoking, stop any regular Alcohol  and or any Recreational drug use.  Wear Seat belts while driving.  Please note: You were cared for by a hospitalist during your hospital stay. Once you are discharged, your primary care physician will handle any further medical issues. Please note that NO REFILLS for any discharge medications will be authorized once you are discharged, as it is imperative that you return to your primary care physician (or establish a relationship with a primary care physician if you do not have one) for your post hospital discharge needs so that they can reassess your need for medications and monitor your lab values.  Total Time spent coordinating discharge including counseling, education and face to face time equals greater than 30 minutes.  SignedJeoffrey Massed 05/01/2023 8:50 AM

## 2023-05-01 NOTE — Progress Notes (Signed)
 Notified Denyse Amass with [facility] that pt has been DC. Rollator was delivered yesterday.

## 2023-05-01 NOTE — Plan of Care (Signed)
  Problem: Education: Goal: Knowledge of General Education information will improve Description: Including pain rating scale, medication(s)/side effects and non-pharmacologic comfort measures Outcome: Adequate for Discharge   Problem: Health Behavior/Discharge Planning: Goal: Ability to manage health-related needs will improve Outcome: Adequate for Discharge   Problem: Clinical Measurements: Goal: Ability to maintain clinical measurements within normal limits will improve Outcome: Adequate for Discharge Goal: Will remain free from infection Outcome: Adequate for Discharge Goal: Diagnostic test results will improve Outcome: Adequate for Discharge Goal: Respiratory complications will improve Outcome: Adequate for Discharge Goal: Cardiovascular complication will be avoided Outcome: Adequate for Discharge   Problem: Activity: Goal: Risk for activity intolerance will decrease Outcome: Adequate for Discharge   Problem: Nutrition: Goal: Adequate nutrition will be maintained Outcome: Adequate for Discharge   Problem: Coping: Goal: Level of anxiety will decrease Outcome: Adequate for Discharge   Problem: Elimination: Goal: Will not experience complications related to bowel motility Outcome: Adequate for Discharge Goal: Will not experience complications related to urinary retention Outcome: Adequate for Discharge   Problem: Pain Managment: Goal: General experience of comfort will improve and/or be controlled Outcome: Adequate for Discharge   Problem: Safety: Goal: Ability to remain free from injury will improve Outcome: Adequate for Discharge   Problem: Skin Integrity: Goal: Risk for impaired skin integrity will decrease Outcome: Adequate for Discharge   Problem: Activity: Goal: Ability to tolerate increased activity will improve Outcome: Adequate for Discharge   Problem: Clinical Measurements: Goal: Ability to maintain a body temperature in the normal range will  improve Outcome: Adequate for Discharge   Problem: Respiratory: Goal: Ability to maintain adequate ventilation will improve Outcome: Adequate for Discharge Goal: Ability to maintain a clear airway will improve Outcome: Adequate for Discharge   Problem: Education: Goal: Knowledge of disease or condition will improve Outcome: Adequate for Discharge Goal: Knowledge of the prescribed therapeutic regimen will improve Outcome: Adequate for Discharge Goal: Individualized Educational Video(s) Outcome: Adequate for Discharge   Problem: Activity: Goal: Ability to tolerate increased activity will improve Outcome: Adequate for Discharge Goal: Will verbalize the importance of balancing activity with adequate rest periods Outcome: Adequate for Discharge   Problem: Respiratory: Goal: Ability to maintain a clear airway will improve Outcome: Adequate for Discharge Goal: Levels of oxygenation will improve Outcome: Adequate for Discharge Goal: Ability to maintain adequate ventilation will improve Outcome: Adequate for Discharge

## 2023-05-01 NOTE — Progress Notes (Signed)
 Physical Therapy Treatment Patient Details Name: Tami Zamora MRN: [id number] DOB: November 11, 1965 Today's Date: 05/01/2023   History of Present Illness Tami Zamora is a 58 y.o. female who presented to the ED on 04/27/2023 with shortness of breath, noted to be lethargic and hypoxic worse than baseline. Workup revealed respiratory acidosis, Flu A +, Lt mid/base lung infiltrate. PT treated w/ BiPAP overnight but has improved significantly from both mental and respiratory standpoint. PMH significant for lung CA s/p lobectomy, CAD s/p PCI, 2L O2-dependent COPD/asthma, adrenal insufficiency.    PT Comments  Agreeable to limited therapy session, adjusted rollator height appropriately, educated on set-up, and safe use. Able to transfer from multiple surfaces at a mod I level. Mild dyspnea with 2L supplemental O2, feels close to baseline but has tremors that she reports is new with the increase in steroids. Stable with rollator while ambulating in room, demonstrates good understanding and control. All questions answered. Seems more open to HHPT if they wear masks - informed her to speak with [facility] agency before they send out therapist to make this request as it is a very reasonable request in my opinion.     If plan is discharge home, recommend the following: A little help with walking and/or transfers;A little help with bathing/dressing/bathroom;Assistance with cooking/housework;Assist for transportation;Help with stairs or ramp for entrance   Can travel by private vehicle        Equipment Recommendations  Rollator (4 wheels)    Recommendations for Other Services       Precautions / Restrictions Precautions Precautions: Fall;Other (comment) Recall of Precautions/Restrictions: Intact Precaution/Restrictions Comments: watch SpO2 Restrictions Weight Bearing Restrictions Per Provider Order: No     Mobility  Bed Mobility Overal bed mobility: Modified Independent             General bed mobility  comments: no assist required    Transfers Overall transfer level: Modified independent Equipment used: Rollator (4 wheels) Transfers: Sit to/from Stand             General transfer comment: Mod I performed from multiple surfaces today including bed, RW, and standard chair. Good stability. Educated on rollator use, brake application, and appropriate use.    Ambulation/Gait Ambulation/Gait assistance: Supervision Gait Distance (Feet): 25 Feet Assistive device: Rollator (4 wheels) Gait Pattern/deviations: Step-through pattern, Decreased stride length Gait velocity: decreased Gait velocity interpretation: <1.31 ft/sec, indicative of household ambulator   General Gait Details: Educated on rollator use, demonstrates good control, supervision for safety. Declines further distance at this time, would like to stay in room. No LOB, Device provides adequate support and is beneficial for energy conservation.   Stairs             Wheelchair Mobility     Tilt Bed    Modified Rankin (Stroke Patients Only)       Balance Overall balance assessment: Needs assistance Sitting-balance support: Feet supported, No upper extremity supported Sitting balance-Leahy Scale: Good     Standing balance support: During functional activity, No upper extremity supported Standing balance-Leahy Scale: Fair Standing balance comment: static standing                            Communication Communication Communication: No apparent difficulties  Cognition Arousal: Alert Behavior During Therapy: WFL for tasks assessed/performed   PT - Cognitive impairments: No apparent impairments  PT - Cognition Comments: Intermittent delay with processing. Pt reports hx of brain fog Following commands: Intact      Cueing Cueing Techniques: Verbal cues  Exercises      General Comments General comments (skin integrity, edema, etc.): On 2L supplemental mild  dyspnea, pt reports close to baseline but complains of tremors she states is likely from steroid increase.      Pertinent Vitals/Pain Pain Assessment Pain Assessment: No/denies pain    Home Living                          Prior Function            PT Goals (current goals can now be found in the care plan section) Acute Rehab PT Goals Patient Stated Goal: home, independence PT Goal Formulation: With patient/family Time For Goal Achievement: 05/12/23 Potential to Achieve Goals: Good Progress towards PT goals: Progressing toward goals    Frequency    Min 1X/week      PT Plan      Co-evaluation              AM-PAC PT "6 Clicks" Mobility   Outcome Measure  Help needed turning from your back to your side while in a flat bed without using bedrails?: None Help needed moving from lying on your back to sitting on the side of a flat bed without using bedrails?: None Help needed moving to and from a bed to a chair (including a wheelchair)?: None Help needed standing up from a chair using your arms (e.g., wheelchair or bedside chair)?: None Help needed to walk in hospital room?: A Little Help needed climbing 3-5 steps with a railing? : A Little 6 Click Score: 22    End of Session Equipment Utilized During Treatment: Oxygen;Gait belt Activity Tolerance: Patient tolerated treatment well Patient left: in bed;with call bell/phone within reach;with family/visitor present;with bed alarm set   PT Visit Diagnosis: Other abnormalities of gait and mobility (R26.89);Muscle weakness (generalized) (M62.81);Difficulty in walking, not elsewhere classified (R26.2)     Time: 4098-1191 PT Time Calculation (min) (ACUTE ONLY): 21 min  Charges:    $Therapeutic Activity: 8-22 mins PT General Charges $$ ACUTE PT VISIT: 1 Visit                     Kathlyn Sacramento, PT, DPT [facility]  Rehabilitation Services Physical Therapist Office: [phone] Website:  [email or web address]    Berton Mount 05/01/2023, 11:39 AM

## 2023-05-06 DIAGNOSIS — J9611 Chronic respiratory failure with hypoxia: Secondary | ICD-10-CM | POA: Diagnosis not present

## 2023-05-06 DIAGNOSIS — I10 Essential (primary) hypertension: Secondary | ICD-10-CM | POA: Diagnosis not present

## 2023-05-06 DIAGNOSIS — M159 Polyosteoarthritis, unspecified: Secondary | ICD-10-CM | POA: Diagnosis not present

## 2023-05-06 DIAGNOSIS — R600 Localized edema: Secondary | ICD-10-CM | POA: Diagnosis not present

## 2023-05-06 DIAGNOSIS — G8929 Other chronic pain: Secondary | ICD-10-CM | POA: Diagnosis not present

## 2023-05-06 DIAGNOSIS — E2740 Unspecified adrenocortical insufficiency: Secondary | ICD-10-CM | POA: Diagnosis not present

## 2023-05-06 DIAGNOSIS — J449 Chronic obstructive pulmonary disease, unspecified: Secondary | ICD-10-CM | POA: Diagnosis not present

## 2023-05-06 DIAGNOSIS — R110 Nausea: Secondary | ICD-10-CM | POA: Diagnosis not present

## 2023-05-06 DIAGNOSIS — M549 Dorsalgia, unspecified: Secondary | ICD-10-CM | POA: Diagnosis not present

## 2023-05-11 DIAGNOSIS — E2740 Unspecified adrenocortical insufficiency: Secondary | ICD-10-CM | POA: Diagnosis not present

## 2023-05-11 DIAGNOSIS — M159 Polyosteoarthritis, unspecified: Secondary | ICD-10-CM | POA: Diagnosis not present

## 2023-05-11 DIAGNOSIS — J309 Allergic rhinitis, unspecified: Secondary | ICD-10-CM | POA: Diagnosis not present

## 2023-05-11 DIAGNOSIS — I10 Essential (primary) hypertension: Secondary | ICD-10-CM | POA: Diagnosis not present

## 2023-05-11 DIAGNOSIS — J9611 Chronic respiratory failure with hypoxia: Secondary | ICD-10-CM | POA: Diagnosis not present

## 2023-05-11 DIAGNOSIS — M549 Dorsalgia, unspecified: Secondary | ICD-10-CM | POA: Diagnosis not present

## 2023-05-11 DIAGNOSIS — M25552 Pain in left hip: Secondary | ICD-10-CM | POA: Diagnosis not present

## 2023-05-11 DIAGNOSIS — R110 Nausea: Secondary | ICD-10-CM | POA: Diagnosis not present

## 2023-05-11 DIAGNOSIS — J449 Chronic obstructive pulmonary disease, unspecified: Secondary | ICD-10-CM | POA: Diagnosis not present

## 2023-05-13 DIAGNOSIS — J301 Allergic rhinitis due to pollen: Secondary | ICD-10-CM | POA: Diagnosis not present

## 2023-05-13 DIAGNOSIS — F17210 Nicotine dependence, cigarettes, uncomplicated: Secondary | ICD-10-CM | POA: Diagnosis not present

## 2023-05-13 DIAGNOSIS — J449 Chronic obstructive pulmonary disease, unspecified: Secondary | ICD-10-CM | POA: Diagnosis not present

## 2023-05-13 DIAGNOSIS — J9611 Chronic respiratory failure with hypoxia: Secondary | ICD-10-CM | POA: Diagnosis not present

## 2023-05-13 DIAGNOSIS — I2510 Atherosclerotic heart disease of native coronary artery without angina pectoris: Secondary | ICD-10-CM | POA: Diagnosis not present

## 2023-05-25 DIAGNOSIS — E2740 Unspecified adrenocortical insufficiency: Secondary | ICD-10-CM | POA: Diagnosis not present

## 2023-05-25 DIAGNOSIS — R110 Nausea: Secondary | ICD-10-CM | POA: Diagnosis not present

## 2023-05-25 DIAGNOSIS — M159 Polyosteoarthritis, unspecified: Secondary | ICD-10-CM | POA: Diagnosis not present

## 2023-05-25 DIAGNOSIS — M25552 Pain in left hip: Secondary | ICD-10-CM | POA: Diagnosis not present

## 2023-05-25 DIAGNOSIS — I10 Essential (primary) hypertension: Secondary | ICD-10-CM | POA: Diagnosis not present

## 2023-05-25 DIAGNOSIS — J9611 Chronic respiratory failure with hypoxia: Secondary | ICD-10-CM | POA: Diagnosis not present

## 2023-05-25 DIAGNOSIS — M549 Dorsalgia, unspecified: Secondary | ICD-10-CM | POA: Diagnosis not present

## 2023-05-25 DIAGNOSIS — J309 Allergic rhinitis, unspecified: Secondary | ICD-10-CM | POA: Diagnosis not present

## 2023-05-25 DIAGNOSIS — G8929 Other chronic pain: Secondary | ICD-10-CM | POA: Diagnosis not present

## 2023-05-25 DIAGNOSIS — J449 Chronic obstructive pulmonary disease, unspecified: Secondary | ICD-10-CM | POA: Diagnosis not present

## 2023-06-02 DIAGNOSIS — J9611 Chronic respiratory failure with hypoxia: Secondary | ICD-10-CM | POA: Diagnosis not present

## 2023-06-02 DIAGNOSIS — J309 Allergic rhinitis, unspecified: Secondary | ICD-10-CM | POA: Diagnosis not present

## 2023-06-02 DIAGNOSIS — Z681 Body mass index (BMI) 19 or less, adult: Secondary | ICD-10-CM | POA: Diagnosis not present

## 2023-06-02 DIAGNOSIS — Z79891 Long term (current) use of opiate analgesic: Secondary | ICD-10-CM | POA: Diagnosis not present

## 2023-06-02 DIAGNOSIS — J449 Chronic obstructive pulmonary disease, unspecified: Secondary | ICD-10-CM | POA: Diagnosis not present

## 2023-06-08 DIAGNOSIS — Z720 Tobacco use: Secondary | ICD-10-CM | POA: Diagnosis not present

## 2023-06-08 DIAGNOSIS — I2510 Atherosclerotic heart disease of native coronary artery without angina pectoris: Secondary | ICD-10-CM | POA: Diagnosis not present

## 2023-06-08 DIAGNOSIS — J449 Chronic obstructive pulmonary disease, unspecified: Secondary | ICD-10-CM | POA: Diagnosis not present

## 2023-06-08 DIAGNOSIS — J441 Chronic obstructive pulmonary disease with (acute) exacerbation: Secondary | ICD-10-CM | POA: Diagnosis not present

## 2023-06-08 DIAGNOSIS — R079 Chest pain, unspecified: Secondary | ICD-10-CM | POA: Diagnosis not present

## 2023-06-08 DIAGNOSIS — M25552 Pain in left hip: Secondary | ICD-10-CM | POA: Diagnosis not present

## 2023-06-08 DIAGNOSIS — R072 Precordial pain: Secondary | ICD-10-CM | POA: Diagnosis not present

## 2023-06-08 DIAGNOSIS — J432 Centrilobular emphysema: Secondary | ICD-10-CM | POA: Diagnosis not present

## 2023-06-08 DIAGNOSIS — R002 Palpitations: Secondary | ICD-10-CM | POA: Diagnosis not present

## 2023-06-08 DIAGNOSIS — I2109 ST elevation (STEMI) myocardial infarction involving other coronary artery of anterior wall: Secondary | ICD-10-CM | POA: Diagnosis not present

## 2023-06-08 DIAGNOSIS — I10 Essential (primary) hypertension: Secondary | ICD-10-CM | POA: Diagnosis not present

## 2023-06-08 DIAGNOSIS — J9611 Chronic respiratory failure with hypoxia: Secondary | ICD-10-CM | POA: Diagnosis not present

## 2023-06-09 DIAGNOSIS — J449 Chronic obstructive pulmonary disease, unspecified: Secondary | ICD-10-CM | POA: Diagnosis not present

## 2023-06-09 DIAGNOSIS — I10 Essential (primary) hypertension: Secondary | ICD-10-CM | POA: Diagnosis not present

## 2023-06-09 DIAGNOSIS — E039 Hypothyroidism, unspecified: Secondary | ICD-10-CM | POA: Diagnosis not present

## 2023-06-09 DIAGNOSIS — G8929 Other chronic pain: Secondary | ICD-10-CM | POA: Diagnosis not present

## 2023-06-09 DIAGNOSIS — E785 Hyperlipidemia, unspecified: Secondary | ICD-10-CM | POA: Diagnosis not present

## 2023-06-09 DIAGNOSIS — M549 Dorsalgia, unspecified: Secondary | ICD-10-CM | POA: Diagnosis not present

## 2023-06-09 DIAGNOSIS — R110 Nausea: Secondary | ICD-10-CM | POA: Diagnosis not present

## 2023-06-09 DIAGNOSIS — J9611 Chronic respiratory failure with hypoxia: Secondary | ICD-10-CM | POA: Diagnosis not present

## 2023-06-09 DIAGNOSIS — J309 Allergic rhinitis, unspecified: Secondary | ICD-10-CM | POA: Diagnosis not present

## 2023-06-09 DIAGNOSIS — E2740 Unspecified adrenocortical insufficiency: Secondary | ICD-10-CM | POA: Diagnosis not present

## 2023-06-09 DIAGNOSIS — M159 Polyosteoarthritis, unspecified: Secondary | ICD-10-CM | POA: Diagnosis not present

## 2023-06-09 DIAGNOSIS — D509 Iron deficiency anemia, unspecified: Secondary | ICD-10-CM | POA: Diagnosis not present

## 2023-06-17 DIAGNOSIS — G4733 Obstructive sleep apnea (adult) (pediatric): Secondary | ICD-10-CM | POA: Diagnosis not present

## 2023-06-18 DIAGNOSIS — R072 Precordial pain: Secondary | ICD-10-CM | POA: Diagnosis not present

## 2023-06-18 DIAGNOSIS — I10 Essential (primary) hypertension: Secondary | ICD-10-CM | POA: Diagnosis not present

## 2023-06-18 DIAGNOSIS — R002 Palpitations: Secondary | ICD-10-CM | POA: Diagnosis not present

## 2023-06-18 DIAGNOSIS — R0602 Shortness of breath: Secondary | ICD-10-CM | POA: Diagnosis not present

## 2023-06-18 DIAGNOSIS — R079 Chest pain, unspecified: Secondary | ICD-10-CM | POA: Diagnosis not present

## 2023-06-18 DIAGNOSIS — I2510 Atherosclerotic heart disease of native coronary artery without angina pectoris: Secondary | ICD-10-CM | POA: Diagnosis not present

## 2023-06-18 DIAGNOSIS — J432 Centrilobular emphysema: Secondary | ICD-10-CM | POA: Diagnosis not present

## 2023-06-18 DIAGNOSIS — I2109 ST elevation (STEMI) myocardial infarction involving other coronary artery of anterior wall: Secondary | ICD-10-CM | POA: Diagnosis not present

## 2023-06-20 DIAGNOSIS — Z79891 Long term (current) use of opiate analgesic: Secondary | ICD-10-CM | POA: Diagnosis not present

## 2023-06-22 DIAGNOSIS — I351 Nonrheumatic aortic (valve) insufficiency: Secondary | ICD-10-CM | POA: Diagnosis not present

## 2023-06-22 DIAGNOSIS — I491 Atrial premature depolarization: Secondary | ICD-10-CM | POA: Diagnosis not present

## 2023-06-22 DIAGNOSIS — R002 Palpitations: Secondary | ICD-10-CM | POA: Diagnosis not present

## 2023-06-22 DIAGNOSIS — I493 Ventricular premature depolarization: Secondary | ICD-10-CM | POA: Diagnosis not present

## 2023-06-22 DIAGNOSIS — R079 Chest pain, unspecified: Secondary | ICD-10-CM | POA: Diagnosis not present

## 2023-06-23 DIAGNOSIS — J449 Chronic obstructive pulmonary disease, unspecified: Secondary | ICD-10-CM | POA: Diagnosis not present

## 2023-06-23 DIAGNOSIS — J309 Allergic rhinitis, unspecified: Secondary | ICD-10-CM | POA: Diagnosis not present

## 2023-06-23 DIAGNOSIS — J9611 Chronic respiratory failure with hypoxia: Secondary | ICD-10-CM | POA: Diagnosis not present

## 2023-06-23 DIAGNOSIS — E2740 Unspecified adrenocortical insufficiency: Secondary | ICD-10-CM | POA: Diagnosis not present

## 2023-06-23 DIAGNOSIS — R600 Localized edema: Secondary | ICD-10-CM | POA: Diagnosis not present

## 2023-06-23 DIAGNOSIS — M549 Dorsalgia, unspecified: Secondary | ICD-10-CM | POA: Diagnosis not present

## 2023-06-23 DIAGNOSIS — M159 Polyosteoarthritis, unspecified: Secondary | ICD-10-CM | POA: Diagnosis not present

## 2023-06-23 DIAGNOSIS — G8929 Other chronic pain: Secondary | ICD-10-CM | POA: Diagnosis not present

## 2023-06-23 DIAGNOSIS — I10 Essential (primary) hypertension: Secondary | ICD-10-CM | POA: Diagnosis not present

## 2023-06-23 DIAGNOSIS — R110 Nausea: Secondary | ICD-10-CM | POA: Diagnosis not present

## 2023-07-12 DIAGNOSIS — R002 Palpitations: Secondary | ICD-10-CM | POA: Diagnosis not present

## 2023-07-13 DIAGNOSIS — I491 Atrial premature depolarization: Secondary | ICD-10-CM | POA: Diagnosis not present

## 2023-07-13 DIAGNOSIS — I493 Ventricular premature depolarization: Secondary | ICD-10-CM | POA: Diagnosis not present

## 2023-07-15 DIAGNOSIS — I2109 ST elevation (STEMI) myocardial infarction involving other coronary artery of anterior wall: Secondary | ICD-10-CM | POA: Diagnosis not present

## 2023-07-15 DIAGNOSIS — J449 Chronic obstructive pulmonary disease, unspecified: Secondary | ICD-10-CM | POA: Diagnosis not present

## 2023-07-15 DIAGNOSIS — I10 Essential (primary) hypertension: Secondary | ICD-10-CM | POA: Diagnosis not present

## 2023-07-15 DIAGNOSIS — Z720 Tobacco use: Secondary | ICD-10-CM | POA: Diagnosis not present

## 2023-07-15 DIAGNOSIS — I2510 Atherosclerotic heart disease of native coronary artery without angina pectoris: Secondary | ICD-10-CM | POA: Diagnosis not present

## 2023-07-15 DIAGNOSIS — J9611 Chronic respiratory failure with hypoxia: Secondary | ICD-10-CM | POA: Diagnosis not present

## 2023-07-21 DIAGNOSIS — E2740 Unspecified adrenocortical insufficiency: Secondary | ICD-10-CM | POA: Diagnosis not present

## 2023-07-21 DIAGNOSIS — J449 Chronic obstructive pulmonary disease, unspecified: Secondary | ICD-10-CM | POA: Diagnosis not present

## 2023-07-21 DIAGNOSIS — J309 Allergic rhinitis, unspecified: Secondary | ICD-10-CM | POA: Diagnosis not present

## 2023-07-21 DIAGNOSIS — I10 Essential (primary) hypertension: Secondary | ICD-10-CM | POA: Diagnosis not present

## 2023-07-29 DIAGNOSIS — F17210 Nicotine dependence, cigarettes, uncomplicated: Secondary | ICD-10-CM | POA: Diagnosis not present

## 2023-07-29 DIAGNOSIS — J449 Chronic obstructive pulmonary disease, unspecified: Secondary | ICD-10-CM | POA: Diagnosis not present

## 2023-07-29 DIAGNOSIS — J9611 Chronic respiratory failure with hypoxia: Secondary | ICD-10-CM | POA: Diagnosis not present

## 2023-08-18 DIAGNOSIS — E2740 Unspecified adrenocortical insufficiency: Secondary | ICD-10-CM | POA: Diagnosis not present

## 2023-08-18 DIAGNOSIS — I10 Essential (primary) hypertension: Secondary | ICD-10-CM | POA: Diagnosis not present

## 2023-08-18 DIAGNOSIS — M159 Polyosteoarthritis, unspecified: Secondary | ICD-10-CM | POA: Diagnosis not present

## 2023-08-18 DIAGNOSIS — M549 Dorsalgia, unspecified: Secondary | ICD-10-CM | POA: Diagnosis not present

## 2023-08-18 DIAGNOSIS — R110 Nausea: Secondary | ICD-10-CM | POA: Diagnosis not present

## 2023-08-18 DIAGNOSIS — J449 Chronic obstructive pulmonary disease, unspecified: Secondary | ICD-10-CM | POA: Diagnosis not present

## 2023-08-18 DIAGNOSIS — J309 Allergic rhinitis, unspecified: Secondary | ICD-10-CM | POA: Diagnosis not present

## 2023-08-18 DIAGNOSIS — J9611 Chronic respiratory failure with hypoxia: Secondary | ICD-10-CM | POA: Diagnosis not present

## 2023-08-18 DIAGNOSIS — B370 Candidal stomatitis: Secondary | ICD-10-CM | POA: Diagnosis not present

## 2023-08-18 DIAGNOSIS — E785 Hyperlipidemia, unspecified: Secondary | ICD-10-CM | POA: Diagnosis not present

## 2023-08-30 DIAGNOSIS — J301 Allergic rhinitis due to pollen: Secondary | ICD-10-CM | POA: Diagnosis not present

## 2023-08-30 DIAGNOSIS — J449 Chronic obstructive pulmonary disease, unspecified: Secondary | ICD-10-CM | POA: Diagnosis not present

## 2023-08-30 DIAGNOSIS — J953 Chronic pulmonary insufficiency following surgery: Secondary | ICD-10-CM | POA: Diagnosis not present

## 2023-09-15 DIAGNOSIS — M159 Polyosteoarthritis, unspecified: Secondary | ICD-10-CM | POA: Diagnosis not present

## 2023-09-15 DIAGNOSIS — Z9181 History of falling: Secondary | ICD-10-CM | POA: Diagnosis not present

## 2023-09-15 DIAGNOSIS — J449 Chronic obstructive pulmonary disease, unspecified: Secondary | ICD-10-CM | POA: Diagnosis not present

## 2023-09-15 DIAGNOSIS — Z681 Body mass index (BMI) 19 or less, adult: Secondary | ICD-10-CM | POA: Diagnosis not present

## 2023-09-15 DIAGNOSIS — J309 Allergic rhinitis, unspecified: Secondary | ICD-10-CM | POA: Diagnosis not present

## 2023-09-15 DIAGNOSIS — G459 Transient cerebral ischemic attack, unspecified: Secondary | ICD-10-CM | POA: Diagnosis not present

## 2023-09-15 DIAGNOSIS — Z0000 Encounter for general adult medical examination without abnormal findings: Secondary | ICD-10-CM | POA: Diagnosis not present

## 2023-09-15 DIAGNOSIS — E2740 Unspecified adrenocortical insufficiency: Secondary | ICD-10-CM | POA: Diagnosis not present

## 2023-09-15 DIAGNOSIS — I10 Essential (primary) hypertension: Secondary | ICD-10-CM | POA: Diagnosis not present

## 2023-10-07 DIAGNOSIS — I2510 Atherosclerotic heart disease of native coronary artery without angina pectoris: Secondary | ICD-10-CM | POA: Diagnosis not present

## 2023-10-07 DIAGNOSIS — J301 Allergic rhinitis due to pollen: Secondary | ICD-10-CM | POA: Diagnosis not present

## 2023-10-07 DIAGNOSIS — F17210 Nicotine dependence, cigarettes, uncomplicated: Secondary | ICD-10-CM | POA: Diagnosis not present

## 2023-10-07 DIAGNOSIS — J449 Chronic obstructive pulmonary disease, unspecified: Secondary | ICD-10-CM | POA: Diagnosis not present

## 2023-10-07 DIAGNOSIS — J9611 Chronic respiratory failure with hypoxia: Secondary | ICD-10-CM | POA: Diagnosis not present

## 2023-10-13 DIAGNOSIS — J9611 Chronic respiratory failure with hypoxia: Secondary | ICD-10-CM | POA: Diagnosis not present

## 2023-10-13 DIAGNOSIS — J309 Allergic rhinitis, unspecified: Secondary | ICD-10-CM | POA: Diagnosis not present

## 2023-10-13 DIAGNOSIS — D509 Iron deficiency anemia, unspecified: Secondary | ICD-10-CM | POA: Diagnosis not present

## 2023-10-13 DIAGNOSIS — M159 Polyosteoarthritis, unspecified: Secondary | ICD-10-CM | POA: Diagnosis not present

## 2023-10-13 DIAGNOSIS — R110 Nausea: Secondary | ICD-10-CM | POA: Diagnosis not present

## 2023-10-13 DIAGNOSIS — E2740 Unspecified adrenocortical insufficiency: Secondary | ICD-10-CM | POA: Diagnosis not present

## 2023-10-13 DIAGNOSIS — E785 Hyperlipidemia, unspecified: Secondary | ICD-10-CM | POA: Diagnosis not present

## 2023-10-13 DIAGNOSIS — M549 Dorsalgia, unspecified: Secondary | ICD-10-CM | POA: Diagnosis not present

## 2023-10-13 DIAGNOSIS — I10 Essential (primary) hypertension: Secondary | ICD-10-CM | POA: Diagnosis not present

## 2023-10-13 DIAGNOSIS — J449 Chronic obstructive pulmonary disease, unspecified: Secondary | ICD-10-CM | POA: Diagnosis not present

## 2023-11-10 DIAGNOSIS — R110 Nausea: Secondary | ICD-10-CM | POA: Diagnosis not present

## 2023-11-10 DIAGNOSIS — E785 Hyperlipidemia, unspecified: Secondary | ICD-10-CM | POA: Diagnosis not present

## 2023-11-10 DIAGNOSIS — E2740 Unspecified adrenocortical insufficiency: Secondary | ICD-10-CM | POA: Diagnosis not present

## 2023-11-10 DIAGNOSIS — J9611 Chronic respiratory failure with hypoxia: Secondary | ICD-10-CM | POA: Diagnosis not present

## 2023-11-10 DIAGNOSIS — J309 Allergic rhinitis, unspecified: Secondary | ICD-10-CM | POA: Diagnosis not present

## 2023-11-10 DIAGNOSIS — J449 Chronic obstructive pulmonary disease, unspecified: Secondary | ICD-10-CM | POA: Diagnosis not present

## 2023-11-10 DIAGNOSIS — M159 Polyosteoarthritis, unspecified: Secondary | ICD-10-CM | POA: Diagnosis not present

## 2023-11-10 DIAGNOSIS — N3281 Overactive bladder: Secondary | ICD-10-CM | POA: Diagnosis not present

## 2023-11-23 DIAGNOSIS — F17210 Nicotine dependence, cigarettes, uncomplicated: Secondary | ICD-10-CM | POA: Diagnosis not present

## 2023-11-23 DIAGNOSIS — J449 Chronic obstructive pulmonary disease, unspecified: Secondary | ICD-10-CM | POA: Diagnosis not present

## 2023-11-23 DIAGNOSIS — J301 Allergic rhinitis due to pollen: Secondary | ICD-10-CM | POA: Diagnosis not present

## 2023-11-23 DIAGNOSIS — I351 Nonrheumatic aortic (valve) insufficiency: Secondary | ICD-10-CM | POA: Diagnosis not present

## 2023-11-24 DIAGNOSIS — N3281 Overactive bladder: Secondary | ICD-10-CM | POA: Diagnosis not present

## 2023-11-24 DIAGNOSIS — J9611 Chronic respiratory failure with hypoxia: Secondary | ICD-10-CM | POA: Diagnosis not present

## 2023-11-24 DIAGNOSIS — J309 Allergic rhinitis, unspecified: Secondary | ICD-10-CM | POA: Diagnosis not present

## 2023-11-24 DIAGNOSIS — E2740 Unspecified adrenocortical insufficiency: Secondary | ICD-10-CM | POA: Diagnosis not present

## 2023-11-24 DIAGNOSIS — I10 Essential (primary) hypertension: Secondary | ICD-10-CM | POA: Diagnosis not present

## 2023-11-24 DIAGNOSIS — M159 Polyosteoarthritis, unspecified: Secondary | ICD-10-CM | POA: Diagnosis not present

## 2023-11-24 DIAGNOSIS — J449 Chronic obstructive pulmonary disease, unspecified: Secondary | ICD-10-CM | POA: Diagnosis not present

## 2023-11-24 DIAGNOSIS — R110 Nausea: Secondary | ICD-10-CM | POA: Diagnosis not present

## 2023-11-24 DIAGNOSIS — E785 Hyperlipidemia, unspecified: Secondary | ICD-10-CM | POA: Diagnosis not present

## 2023-11-29 DIAGNOSIS — R079 Chest pain, unspecified: Secondary | ICD-10-CM | POA: Diagnosis not present

## 2023-11-29 DIAGNOSIS — R0602 Shortness of breath: Secondary | ICD-10-CM | POA: Diagnosis not present

## 2023-11-29 DIAGNOSIS — Z720 Tobacco use: Secondary | ICD-10-CM | POA: Diagnosis not present

## 2023-11-29 DIAGNOSIS — J449 Chronic obstructive pulmonary disease, unspecified: Secondary | ICD-10-CM | POA: Diagnosis not present

## 2023-11-29 DIAGNOSIS — I25118 Atherosclerotic heart disease of native coronary artery with other forms of angina pectoris: Secondary | ICD-10-CM | POA: Diagnosis not present

## 2023-12-01 DIAGNOSIS — H43813 Vitreous degeneration, bilateral: Secondary | ICD-10-CM | POA: Diagnosis not present

## 2023-12-01 DIAGNOSIS — H25813 Combined forms of age-related cataract, bilateral: Secondary | ICD-10-CM | POA: Diagnosis not present

## 2023-12-13 DIAGNOSIS — J449 Chronic obstructive pulmonary disease, unspecified: Secondary | ICD-10-CM | POA: Diagnosis not present

## 2023-12-13 DIAGNOSIS — J301 Allergic rhinitis due to pollen: Secondary | ICD-10-CM | POA: Diagnosis not present

## 2023-12-13 DIAGNOSIS — J953 Chronic pulmonary insufficiency following surgery: Secondary | ICD-10-CM | POA: Diagnosis not present

## 2023-12-13 DIAGNOSIS — I351 Nonrheumatic aortic (valve) insufficiency: Secondary | ICD-10-CM | POA: Diagnosis not present

## 2024-05-04 ENCOUNTER — Ambulatory Visit: Admitting: "Endocrinology
